# Patient Record
Sex: Male | Born: 1990
Health system: Southern US, Community
[De-identification: ages and names within clinical notes are randomized; demographics above are authoritative.]

## PROBLEM LIST (undated history)

## (undated) ENCOUNTER — Ambulatory Visit (HOSPITAL_COMMUNITY): Payer: Self-pay

## (undated) DIAGNOSIS — G35 Multiple sclerosis: Secondary | ICD-10-CM

## (undated) HISTORY — PX: ARTHROSCOPIC REPAIR ACL: SUR80

---
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
## 2016-04-04 DIAGNOSIS — M25511 Pain in right shoulder: Secondary | ICD-10-CM | POA: Diagnosis not present

## 2016-04-04 DIAGNOSIS — R292 Abnormal reflex: Secondary | ICD-10-CM | POA: Diagnosis not present

## 2016-04-07 DIAGNOSIS — G373 Acute transverse myelitis in demyelinating disease of central nervous system: Secondary | ICD-10-CM | POA: Diagnosis not present

## 2016-04-14 DIAGNOSIS — G379 Demyelinating disease of central nervous system, unspecified: Secondary | ICD-10-CM | POA: Diagnosis not present

## 2016-04-14 DIAGNOSIS — R930 Abnormal findings on diagnostic imaging of skull and head, not elsewhere classified: Secondary | ICD-10-CM | POA: Diagnosis not present

## 2016-04-17 DIAGNOSIS — G373 Acute transverse myelitis in demyelinating disease of central nervous system: Secondary | ICD-10-CM | POA: Diagnosis not present

## 2016-04-21 DIAGNOSIS — G971 Other reaction to spinal and lumbar puncture: Secondary | ICD-10-CM | POA: Diagnosis not present

## 2016-04-24 DIAGNOSIS — R930 Abnormal findings on diagnostic imaging of skull and head, not elsewhere classified: Secondary | ICD-10-CM | POA: Diagnosis not present

## 2016-04-24 DIAGNOSIS — G379 Demyelinating disease of central nervous system, unspecified: Secondary | ICD-10-CM | POA: Diagnosis not present

## 2016-05-03 DIAGNOSIS — G35 Multiple sclerosis: Secondary | ICD-10-CM | POA: Diagnosis not present

## 2016-06-06 DIAGNOSIS — G959 Disease of spinal cord, unspecified: Secondary | ICD-10-CM | POA: Diagnosis not present

## 2016-06-06 DIAGNOSIS — G35 Multiple sclerosis: Secondary | ICD-10-CM | POA: Diagnosis not present

## 2016-09-05 DIAGNOSIS — G35 Multiple sclerosis: Secondary | ICD-10-CM | POA: Diagnosis not present

## 2016-09-09 ENCOUNTER — Ambulatory Visit: Payer: Self-pay | Admitting: Family Medicine

## 2016-09-18 DIAGNOSIS — G35 Multiple sclerosis: Secondary | ICD-10-CM | POA: Insufficient documentation

## 2016-09-26 ENCOUNTER — Encounter: Payer: Self-pay | Admitting: Family Medicine

## 2016-09-26 ENCOUNTER — Ambulatory Visit (INDEPENDENT_AMBULATORY_CARE_PROVIDER_SITE_OTHER): Payer: 59 | Admitting: Family Medicine

## 2016-09-26 VITALS — BP 106/63 | HR 63 | Temp 98.6°F | Resp 16 | Ht 68.11 in | Wt 161.4 lb

## 2016-09-26 DIAGNOSIS — Z79899 Other long term (current) drug therapy: Secondary | ICD-10-CM

## 2016-09-26 DIAGNOSIS — G35 Multiple sclerosis: Secondary | ICD-10-CM

## 2016-09-26 DIAGNOSIS — Z136 Encounter for screening for cardiovascular disorders: Secondary | ICD-10-CM

## 2016-09-26 NOTE — Patient Instructions (Addendum)
  EKG will be in our electronic record that should be accessible from your neurologist, but I have also provided a copy for you if needed.  Let me know if there are any questions or concerns, and we are happy to provide primary care for you when you are here in town.  If you continue to have fatigue or sleepiness, would discuss that with your neurologist as that may be related to other causes. Potentially could also meet with a sleep specialist to see if that requires other testing.   IF you received an x-ray today, you will receive an invoice from [facility]. Please contact [facility] at [phone] with questions or concerns regarding your invoice.   IF you received labwork today, you will receive an invoice from [facility]. Please contact [facility] at [phone] with questions or concerns regarding your invoice.   Our billing staff will not be able to assist you with questions regarding bills from these companies.  You will be contacted with the lab results as soon as they are available. The fastest way to get your results is to activate your My Chart account. Instructions are located on the last page of this paperwork. If you have not heard from [place] regarding the results in 2 weeks, please contact this office.

## 2016-09-26 NOTE — Progress Notes (Signed)
By signing my name below, I, Mesha Guinyard, attest that this documentation has been prepared under the direction and in the presence of Meredith Staggers, MD.  Electronically Signed: Arvilla Market, Medical Scribe. 09/26/16. 4:51 PM.  Subjective:    Patient ID: Juan Mahoney, male    DOB: 1990-09-03, 26 y.o.   MRN: [id number]  HPI Chief Complaint  Patient presents with  . Abnormal ECG    new dx of MS, needs EKG to start medications    HPI Comments: Juan Mahoney is a 26 y.o. male who presents to Primary Care at [facility] for EKG. He has been followed by Dr. Gaynelle Adu at [facility]. Recent notes reviewed in Care Everywhere. Most recent note from June 2018. EKG required before starting gilenya. Most recent visit with neurologist June 11th. His initial consult with neurology was June 5th. Appears he had a recent dx of MS, with MRI on the brain Jan 18th showing several demyelinating lesions, as well as a demyelinating lesion at the cervical medullary junction. Spinal tap positive with elevated IGG index, elevated IGG synthesis rate and 8 unique CSF, oligoclonal bands. FHx of MS with great uncle on his fathers side. Testing performed through [facility], including eye exam, needs EKG today.  Pt's first sxs of MS is when he had upper arm pain that last 2 months, then he couldn't write or dibble the ball last fall. Pt had heart murmur as a baby, but grew out of it. No previous EKGs were done. Pt used to play sports and he's never had chest pain or SOB while playing. Denies current weakness, or numbness.  There are no active problems to display for this patient.  No past medical history on file. Past Surgical History:  Procedure Laterality Date  . ARTHROSCOPIC REPAIR ACL Right    Allergies  Allergen Reactions  . Penicillin G Rash   Prior to Admission medications   Not on File   Social History   Social History  . Marital status: Single    Spouse name: N/A  . Number of  children: N/A  . Years of education: N/A   Occupational History  . Not on file.   Social History Main Topics  . Smoking status: Never Smoker  . Smokeless tobacco: Never Used  . Alcohol use No  . Drug use: No  . Sexual activity: Not Currently   Other Topics Concern  . Not on file   Social History Narrative  . No narrative on file   Review of Systems  Respiratory: Negative for shortness of breath.   Cardiovascular: Negative for chest pain.  Neurological: Negative for weakness and numbness.   Objective:  Physical Exam  Constitutional: He appears well-developed and well-nourished. No distress.  HENT:  Head: Normocephalic and atraumatic.  Eyes: Conjunctivae are normal.  Neck: Neck supple.  Cardiovascular: Normal rate, regular rhythm and normal heart sounds.  Exam reveals no gallop and no friction rub.   No murmur heard. No murmur with valsalva  Pulmonary/Chest: Effort normal and breath sounds normal. No respiratory distress. He has no wheezes. He has no rales.  Neurological: He is alert.  Skin: Skin is warm and dry.  Psychiatric: He has a normal mood and affect. His behavior is normal.  Nursing note and vitals reviewed.   Vitals:   09/26/16 1635  BP: 106/63  Pulse: 63  Resp: 16  Temp: 98.6 F (37 C)  TempSrc: Oral  SpO2: 99%  Weight: 161 lb 6.4  oz (73.2 kg)  Height: 5' 8.11" (1.73 m)  Body mass index is 24.46 kg/m.   [EKG Reading]: Sinus bradycardia. Rate 57.  Assessment & Plan:  Juan Mahoney is a 26 y.o. male Screening for cardiovascular condition - Plan: EKG 12-Lead  High risk medication use - Plan: EKG 12-Lead  Multiple sclerosis (HCC)  Relatively recent diagnosis of multiple sclerosis with plan for new medication by neurology. EKG obtained and copy given to patient.  He did endorse some symptoms of fatigue/sleepiness. Advised to discuss with neurology initially, then can follow-up or can have sleep specialist eval to look into other causes.  No  orders of the defined types were placed in this encounter.  Patient Instructions    EKG will be in our electronic record that should be accessible from your neurologist, but I have also provided a copy for you if needed.  Let me know if there are any questions or concerns, and we are happy to provide primary care for you when you are here in town.  If you continue to have fatigue or sleepiness, would discuss that with your neurologist as that may be related to other causes. Potentially could also meet with a sleep specialist to see if that requires other testing.   IF you received an x-ray today, you will receive an invoice from [facility]. Please contact [facility] at [phone] with questions or concerns regarding your invoice.   IF you received labwork today, you will receive an invoice from [place]. Please contact [facility] at [phone] with questions or concerns regarding your invoice.   Our billing staff will not be able to assist you with questions regarding bills from these companies.  You will be contacted with the lab results as soon as they are available. The fastest way to get your results is to activate your My Chart account. Instructions are located on the last page of this paperwork. If you have not heard from [place] regarding the results in 2 weeks, please contact this office.      I personally performed the services described in this documentation, which was scribed in my presence. The recorded information has been reviewed and considered for accuracy and completeness, addended by me as needed, and agree with information above.  Signed,   Meredith Staggers, MD Primary Care at [facility].  09/30/16 5:23 PM

## 2016-12-16 ENCOUNTER — Encounter (HOSPITAL_COMMUNITY): Payer: Self-pay | Admitting: Emergency Medicine

## 2016-12-16 ENCOUNTER — Emergency Department (HOSPITAL_COMMUNITY)
Admission: EM | Admit: 2016-12-16 | Discharge: 2016-12-16 | Disposition: A | Discharge status: Home / Self Care | Payer: 59 | Attending: Emergency Medicine | Admitting: Emergency Medicine

## 2016-12-16 DIAGNOSIS — Y93G3 Activity, cooking and baking: Secondary | ICD-10-CM | POA: Diagnosis not present

## 2016-12-16 DIAGNOSIS — X102XXA Contact with fats and cooking oils, initial encounter: Secondary | ICD-10-CM | POA: Insufficient documentation

## 2016-12-16 DIAGNOSIS — T24202A Burn of second degree of unspecified site of left lower limb, except ankle and foot, initial encounter: Secondary | ICD-10-CM | POA: Diagnosis not present

## 2016-12-16 DIAGNOSIS — G35 Multiple sclerosis: Secondary | ICD-10-CM | POA: Diagnosis not present

## 2016-12-16 DIAGNOSIS — T22211A Burn of second degree of right forearm, initial encounter: Secondary | ICD-10-CM | POA: Diagnosis not present

## 2016-12-16 DIAGNOSIS — Y92000 Kitchen of unspecified non-institutional (private) residence as  the place of occurrence of the external cause: Secondary | ICD-10-CM | POA: Diagnosis not present

## 2016-12-16 DIAGNOSIS — T24002A Burn of unspecified degree of unspecified site of left lower limb, except ankle and foot, initial encounter: Secondary | ICD-10-CM

## 2016-12-16 DIAGNOSIS — T24012A Burn of unspecified degree of left thigh, initial encounter: Secondary | ICD-10-CM | POA: Insufficient documentation

## 2016-12-16 DIAGNOSIS — T310 Burns involving less than 10% of body surface: Secondary | ICD-10-CM | POA: Insufficient documentation

## 2016-12-16 DIAGNOSIS — T22011A Burn of unspecified degree of right forearm, initial encounter: Secondary | ICD-10-CM | POA: Diagnosis not present

## 2016-12-16 DIAGNOSIS — T23001A Burn of unspecified degree of right hand, unspecified site, initial encounter: Secondary | ICD-10-CM | POA: Insufficient documentation

## 2016-12-16 DIAGNOSIS — Z23 Encounter for immunization: Secondary | ICD-10-CM | POA: Insufficient documentation

## 2016-12-16 DIAGNOSIS — Y999 Unspecified external cause status: Secondary | ICD-10-CM | POA: Diagnosis not present

## 2016-12-16 MED ORDER — SILVER SULFADIAZINE 1 % EX CREA
TOPICAL_CREAM | Freq: Once | CUTANEOUS | Status: AC
  Administered 2016-12-16: 23:00:00 via TOPICAL
  Filled 2016-12-16: qty 85

## 2016-12-16 MED ORDER — SILVER SULFADIAZINE 1 % EX CREA: 1.0000 "application " | TOPICAL_CREAM | Freq: Every day | CUTANEOUS | 0 refills | Status: DC

## 2016-12-16 MED ORDER — OXYCODONE-ACETAMINOPHEN 5-325 MG PO TABS: 1.0000 | ORAL_TABLET | Freq: Four times a day (QID) | ORAL | 0 refills | Status: DC | PRN

## 2016-12-16 MED ORDER — TETANUS-DIPHTH-ACELL PERTUSSIS 5-2.5-18.5 LF-MCG/0.5 IM SUSP
0.5000 mL | Freq: Once | INTRAMUSCULAR | Status: AC
  Administered 2016-12-16: 0.5 mL via INTRAMUSCULAR
  Filled 2016-12-16: qty 0.5

## 2016-12-16 MED ORDER — OXYCODONE-ACETAMINOPHEN 5-325 MG PO TABS
1.0000 | ORAL_TABLET | Freq: Once | ORAL | Status: AC
  Administered 2016-12-16: 1 via ORAL
  Filled 2016-12-16: qty 1
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
## 2016-12-16 NOTE — Discharge Instructions (Signed)
Please take Ibuprofen (Advil, motrin) and Tylenol (acetaminophen) to relieve your pain.  You may take up to 600 MG (3 pills) of normal strength ibuprofen every 8 hours as needed.  In between doses of ibuprofen you make take tylenol, up to 1,000 mg (two extra strength pills).  Do not take more than 3,000 mg tylenol in a 24 hour period.  Please check all medication labels as many medications such as pain and cold medications may contain tylenol.  Do not drink alcohol while taking these medications.  Do not take other NSAID'S while taking ibuprofen (such as aleve or naproxen).  Please take ibuprofen with food to decrease stomach upset. ° °Today you received medications that may make you sleepy or impair your ability to make decisions.  For the next 24 hours please do not drive, operate heavy machinery, care for a small child with out another adult present, or perform any activities that may cause harm to you or someone else if you were to fall asleep or be impaired.  ° °You are being prescribed a medication which may make you sleepy. Please follow up of listed precautions for at least 24 hours after taking one dose. ° °While in the ED your blood pressure was high.  Please follow up with your primary care doctor or the wellness clinic for repeat evaluation as you may need medication.  High blood pressure can cause long term, potentially serious, damage if left untreated.  ° ° °

## 2016-12-16 NOTE — ED Notes (Signed)
ED Provider at bedside. 
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
## 2016-12-21 DIAGNOSIS — T23211A Burn of second degree of right thumb (nail), initial encounter: Secondary | ICD-10-CM | POA: Diagnosis not present

## 2016-12-21 DIAGNOSIS — T23271A Burn of second degree of right wrist, initial encounter: Secondary | ICD-10-CM | POA: Diagnosis not present

## 2016-12-21 DIAGNOSIS — G35 Multiple sclerosis: Secondary | ICD-10-CM | POA: Diagnosis not present

## 2016-12-28 DIAGNOSIS — T23271A Burn of second degree of right wrist, initial encounter: Secondary | ICD-10-CM | POA: Diagnosis not present

## 2016-12-28 DIAGNOSIS — T23211A Burn of second degree of right thumb (nail), initial encounter: Secondary | ICD-10-CM | POA: Diagnosis not present

## 2017-09-20 ENCOUNTER — Ambulatory Visit: Payer: 59 | Admitting: Family Medicine

## 2017-09-21 ENCOUNTER — Ambulatory Visit: Payer: 59 | Admitting: Family Medicine

## 2017-11-21 DIAGNOSIS — Z23 Encounter for immunization: Secondary | ICD-10-CM | POA: Diagnosis not present

## 2018-03-29 ENCOUNTER — Other Ambulatory Visit: Payer: Self-pay

## 2018-03-29 ENCOUNTER — Observation Stay (HOSPITAL_COMMUNITY)
Admission: EM | Admit: 2018-03-29 | Discharge: 2018-03-31 | Disposition: A | Discharge status: Home / Self Care | Payer: 59 | Attending: Internal Medicine | Admitting: Internal Medicine

## 2018-03-29 ENCOUNTER — Encounter (HOSPITAL_COMMUNITY): Payer: Self-pay | Admitting: *Deleted

## 2018-03-29 DIAGNOSIS — N179 Acute kidney failure, unspecified: Secondary | ICD-10-CM | POA: Insufficient documentation

## 2018-03-29 DIAGNOSIS — G35 Multiple sclerosis: Secondary | ICD-10-CM | POA: Diagnosis not present

## 2018-03-29 DIAGNOSIS — R112 Nausea with vomiting, unspecified: Secondary | ICD-10-CM | POA: Diagnosis not present

## 2018-03-29 DIAGNOSIS — K862 Cyst of pancreas: Secondary | ICD-10-CM | POA: Diagnosis not present

## 2018-03-29 DIAGNOSIS — E872 Acidosis, unspecified: Secondary | ICD-10-CM

## 2018-03-29 DIAGNOSIS — E860 Dehydration: Secondary | ICD-10-CM | POA: Diagnosis present

## 2018-03-29 DIAGNOSIS — R1013 Epigastric pain: Secondary | ICD-10-CM | POA: Diagnosis not present

## 2018-03-29 DIAGNOSIS — D72829 Elevated white blood cell count, unspecified: Secondary | ICD-10-CM | POA: Diagnosis not present

## 2018-03-29 DIAGNOSIS — A419 Sepsis, unspecified organism: Secondary | ICD-10-CM | POA: Diagnosis not present

## 2018-03-29 DIAGNOSIS — K529 Noninfective gastroenteritis and colitis, unspecified: Secondary | ICD-10-CM | POA: Diagnosis present

## 2018-03-29 DIAGNOSIS — R1110 Vomiting, unspecified: Secondary | ICD-10-CM | POA: Diagnosis not present

## 2018-03-29 DIAGNOSIS — R109 Unspecified abdominal pain: Secondary | ICD-10-CM | POA: Diagnosis not present

## 2018-03-29 HISTORY — DX: Multiple sclerosis: G35

## 2018-03-29 NOTE — ED Triage Notes (Signed)
Pt reports n/v/d with abd pain since 1800 today.  Vomited x 1 and had one diarrhea.  Pt has hx of MS.  He is in NAD.

## 2018-03-30 ENCOUNTER — Emergency Department (HOSPITAL_COMMUNITY): Payer: 59

## 2018-03-30 ENCOUNTER — Encounter (HOSPITAL_COMMUNITY): Payer: Self-pay

## 2018-03-30 ENCOUNTER — Other Ambulatory Visit: Payer: Self-pay

## 2018-03-30 DIAGNOSIS — R1110 Vomiting, unspecified: Secondary | ICD-10-CM | POA: Diagnosis not present

## 2018-03-30 DIAGNOSIS — R112 Nausea with vomiting, unspecified: Secondary | ICD-10-CM | POA: Diagnosis present

## 2018-03-30 DIAGNOSIS — E860 Dehydration: Secondary | ICD-10-CM | POA: Diagnosis not present

## 2018-03-30 DIAGNOSIS — N179 Acute kidney failure, unspecified: Secondary | ICD-10-CM | POA: Diagnosis present

## 2018-03-30 DIAGNOSIS — G35 Multiple sclerosis: Secondary | ICD-10-CM | POA: Diagnosis not present

## 2018-03-30 DIAGNOSIS — K529 Noninfective gastroenteritis and colitis, unspecified: Secondary | ICD-10-CM | POA: Diagnosis present

## 2018-03-30 LAB — COMPREHENSIVE METABOLIC PANEL
ALT: 27 U/L (ref 0–44)
ANION GAP: 13 (ref 5–15)
AST: 36 U/L (ref 15–41)
Albumin: 4.9 g/dL (ref 3.5–5.0)
Alkaline Phosphatase: 57 U/L (ref 38–126)
BUN: 18 mg/dL (ref 6–20)
CO2: 24 mmol/L (ref 22–32)
Calcium: 9.6 mg/dL (ref 8.9–10.3)
Chloride: 102 mmol/L (ref 98–111)
Creatinine, Ser: 1.41 mg/dL — ABNORMAL HIGH (ref 0.61–1.24)
GFR calc non Af Amer: >60 mL/min (ref >60)
Glucose, Bld: 125 mg/dL — ABNORMAL HIGH (ref 70–99)
Potassium: 3.9 mmol/L (ref 3.5–5.1)
Sodium: 139 mmol/L (ref 135–145)
Total Bilirubin: 1.7 mg/dL — ABNORMAL HIGH (ref 0.3–1.2)
Total Protein: 8.1 g/dL (ref 6.5–8.1)

## 2018-03-30 LAB — URINALYSIS, ROUTINE W REFLEX MICROSCOPIC
Bilirubin Urine: NEGATIVE
Glucose, UA: NEGATIVE mg/dL
Hgb urine dipstick: NEGATIVE
Ketones, ur: NEGATIVE mg/dL
Leukocytes, UA: NEGATIVE
NITRITE: NEGATIVE
Protein, ur: NEGATIVE mg/dL
SPECIFIC GRAVITY, URINE: 1.03 (ref 1.005–1.030)
pH: 6 (ref 5.0–8.0)

## 2018-03-30 LAB — INFLUENZA PANEL BY PCR (TYPE A & B)
Influenza A By PCR: NEGATIVE
Influenza B By PCR: NEGATIVE

## 2018-03-30 LAB — CBC
HCT: 52.4 % — ABNORMAL HIGH (ref 39.0–52.0)
HEMOGLOBIN: 17 g/dL (ref 13.0–17.0)
MCH: 27.1 pg (ref 26.0–34.0)
MCHC: 32.4 g/dL (ref 30.0–36.0)
MCV: 83.4 fL (ref 80.0–100.0)
Platelets: 269 10*3/uL (ref 150–400)
RBC: 6.28 MIL/uL — AB (ref 4.22–5.81)
RDW: 12.3 % (ref 11.5–15.5)
WBC: 19.3 10*3/uL — ABNORMAL HIGH (ref 4.0–10.5)
nRBC: 0 % (ref 0.0–0.2)

## 2018-03-30 LAB — LACTIC ACID, PLASMA: Lactic Acid, Venous: 1 mmol/L (ref 0.5–1.9)

## 2018-03-30 LAB — LIPASE, BLOOD: Lipase: 26 U/L (ref 11–51)

## 2018-03-30 LAB — I-STAT CG4 LACTIC ACID, ED
LACTIC ACID, VENOUS: 2.51 mmol/L — AB (ref 0.5–1.9)
LACTIC ACID, VENOUS: 3.52 mmol/L — AB (ref 0.5–1.9)

## 2018-03-30 MED ORDER — SODIUM CHLORIDE 0.9 % IV SOLN
2.0000 g | INTRAVENOUS | Status: DC
  Administered 2018-03-30: 2 g via INTRAVENOUS
  Filled 2018-03-30: qty 20

## 2018-03-30 MED ORDER — ONDANSETRON HCL 4 MG/2ML IJ SOLN: 4.0000 mg | Freq: Four times a day (QID) | INTRAMUSCULAR | Status: DC | PRN

## 2018-03-30 MED ORDER — SODIUM CHLORIDE (PF) 0.9 % IJ SOLN
INTRAMUSCULAR | Status: AC
  Administered 2018-03-30: 30 mL
  Filled 2018-03-30: qty 50

## 2018-03-30 MED ORDER — LACTATED RINGERS IV SOLN
INTRAVENOUS | Status: DC
  Administered 2018-03-30: 17:00:00 via INTRAVENOUS
  Administered 2018-03-30: 125 mL/h via INTRAVENOUS
  Administered 2018-03-31: 02:00:00 via INTRAVENOUS

## 2018-03-30 MED ORDER — ONDANSETRON HCL 4 MG PO TABS: 4.0000 mg | ORAL_TABLET | Freq: Four times a day (QID) | ORAL | Status: DC | PRN

## 2018-03-30 MED ORDER — METRONIDAZOLE IN NACL 5-0.79 MG/ML-% IV SOLN
500.0000 mg | Freq: Three times a day (TID) | INTRAVENOUS | Status: DC
  Administered 2018-03-30: 500 mg via INTRAVENOUS
  Filled 2018-03-30: qty 100

## 2018-03-30 MED ORDER — KETOROLAC TROMETHAMINE 15 MG/ML IJ SOLN
15.0000 mg | Freq: Once | INTRAMUSCULAR | Status: AC
  Administered 2018-03-30: 15 mg via INTRAVENOUS
  Filled 2018-03-30: qty 1

## 2018-03-30 MED ORDER — SODIUM CHLORIDE 0.9 % IV BOLUS
1000.0000 mL | Freq: Once | INTRAVENOUS | Status: AC
  Administered 2018-03-30: 1000 mL via INTRAVENOUS

## 2018-03-30 MED ORDER — IOPAMIDOL (ISOVUE-300) INJECTION 61%
INTRAVENOUS | Status: AC
  Filled 2018-03-30: qty 100

## 2018-03-30 MED ORDER — SODIUM CHLORIDE 0.9 % IV BOLUS
500.0000 mL | Freq: Once | INTRAVENOUS | Status: AC
  Administered 2018-03-30: 500 mL via INTRAVENOUS

## 2018-03-30 MED ORDER — IOPAMIDOL (ISOVUE-300) INJECTION 61%
100.0000 mL | Freq: Once | INTRAVENOUS | Status: AC | PRN
  Administered 2018-03-30: 100 mL via INTRAVENOUS

## 2018-03-30 MED ORDER — ENOXAPARIN SODIUM 40 MG/0.4ML ~~LOC~~ SOLN
40.0000 mg | Freq: Every day | SUBCUTANEOUS | Status: DC
  Administered 2018-03-30: 40 mg via SUBCUTANEOUS
  Filled 2018-03-30: qty 0.4

## 2018-03-30 MED ORDER — ACETAMINOPHEN 325 MG PO TABS: 650.0000 mg | ORAL_TABLET | Freq: Four times a day (QID) | ORAL | Status: DC | PRN

## 2018-03-30 MED ORDER — SODIUM CHLORIDE 0.9 % IV BOLUS (SEPSIS): 250.0000 mL | Freq: Once | INTRAVENOUS | Status: DC

## 2018-03-30 MED ORDER — ONDANSETRON HCL 4 MG/2ML IJ SOLN
4.0000 mg | Freq: Once | INTRAMUSCULAR | Status: AC
  Administered 2018-03-30: 4 mg via INTRAVENOUS
  Filled 2018-03-30: qty 2

## 2018-03-30 MED ORDER — ACETAMINOPHEN 650 MG RE SUPP: 650.0000 mg | Freq: Four times a day (QID) | RECTAL | Status: DC | PRN

## 2018-03-30 NOTE — ED Notes (Signed)
ED TO INPATIENT HANDOFF REPORT  Name/Age/Gender Juan Mahoney 27 y.o. male  Code Status    Code Status Orders  (From admission, onward)         Start     Ordered   03/30/18 0912  Full code  Continuous     03/30/18 0911        Code Status History    This patient has a current code status but no historical code status.      Home/SNF/Other Home  Chief Complaint MS Patient/Flu-Like Symptoms  Level of Care/Admitting Diagnosis ED Disposition    ED Disposition Condition Comment   Admit  [facility]: [facility] [[id number]]  Level of Care: Med-Surg [16]  Diagnosis: Gastroenteritis [161096][[id number]]  Admitting Physician: Tyrone NineGRUNZ, RYAN B [[id number]]  Attending Physician: Tyrone NineGRUNZ, RYAN B [phone][[id number]]  PT Class (Do Not Modify): Observation [104]  PT Acc Code (Do Not Modify): Observation [10022]       Medical History Past Medical History:  Diagnosis Date  . Multiple sclerosis (HCC)     Allergies Allergies  Allergen Reactions  . Penicillin G Rash    DID THE REACTION INVOLVE: Swelling of the face/tongue/throat, SOB, or low BP? No Sudden or severe rash/hives, skin peeling, or the inside of the mouth or nose? Yes Did it require medical treatment? No When did it last happen?2015 If all above answers are "NO", may proceed with cephalosporin use.     IV Location/Drains/Wounds Patient Lines/Drains/Airways Status   Active Line/Drains/Airways    Name:   Placement date:   Placement time:   Site:   Days:   Peripheral IV 03/30/18 Right Antecubital   03/30/18    0003    Antecubital   less than 1   Peripheral IV 03/30/18 Left Antecubital   03/30/18    0120    Antecubital   less than 1          Labs/Imaging Results for orders placed or performed during the hospital encounter of 03/29/18 (from the past 48 hour(s))  Lipase, blood     Status: None   Collection Time: 03/30/18 12:00 AM  Result Value Ref Range   Lipase 26 11 - 51 U/L    Comment: Performed at [facility], [address] [place], [place] [address]  Comprehensive metabolic panel     Status: Abnormal   Collection Time: 03/30/18 12:00 AM  Result Value Ref Range   Sodium 139 135 - 145 mmol/L   Potassium 3.9 3.5 - 5.1 mmol/L   Chloride 102 98 - 111 mmol/L   CO2 24 22 - 32 mmol/L   Glucose, Bld 125 (H) 70 - 99 mg/dL   BUN 18 6 - 20 mg/dL   Creatinine, Ser 8.291.41 (H) 0.61 - 1.24 mg/dL   Calcium 9.6 8.9 - 56.210.3 mg/dL   Total Protein 8.1 6.5 - 8.1 g/dL   Albumin 4.9 3.5 - 5.0 g/dL   AST 36 15 - 41 U/L   ALT 27 0 - 44 U/L   Alkaline Phosphatase 57 38 - 126 U/L   Total Bilirubin 1.7 (H) 0.3 - 1.2 mg/dL   GFR calc non Af Amer >60 >60 mL/min   GFR calc Af Amer >60 >60 mL/min   Anion gap 13 5 - 15    Comment: Performed at [facility], [address] [place], [place] [address]  CBC     Status: Abnormal   Collection Time: 03/30/18 12:00 AM  Result Value  Ref Range   WBC 19.3 (H) 4.0 - 10.5 K/uL   RBC 6.28 (H) 4.22 - 5.81 MIL/uL   Hemoglobin 17.0 13.0 - 17.0 g/dL   HCT 67.6 (H) 19.5 - 09.3 %   MCV 83.4 80.0 - 100.0 fL   MCH 27.1 26.0 - 34.0 pg   MCHC 32.4 30.0 - 36.0 g/dL   RDW 26.7 12.4 - 58.0 %   Platelets 269 150 - 400 K/uL   nRBC 0.0 0.0 - 0.2 %    Comment: Performed at [facility], [address] [place], [place] [address]  Influenza panel by PCR (type A & B)     Status: None   Collection Time: 03/30/18 12:28 AM  Result Value Ref Range   Influenza A By PCR NEGATIVE NEGATIVE   Influenza B By PCR NEGATIVE NEGATIVE    Comment: (NOTE) The Xpert Xpress Flu assay is intended as an aid in the diagnosis of  influenza and should not be used as a sole basis for treatment.  This  assay is FDA approved for nasopharyngeal swab specimens only. Nasal  washings and aspirates are unacceptable for Xpert Xpress Flu testing. Performed at [facility], [address] [place], [place] [address]   I-Stat CG4 Lactic Acid, ED      Status: Abnormal   Collection Time: 03/30/18 12:39 AM  Result Value Ref Range   Lactic Acid, Venous 3.52 (HH) 0.5 - 1.9 mmol/L   Comment NOTIFIED PHYSICIAN   Blood Culture (routine x 2)     Status: None (Preliminary result)   Collection Time: 03/30/18 12:54 AM  Result Value Ref Range   Specimen Description      BLOOD LEFT HAND Performed at [facility], [address] [place], [place] [address]    Special Requests      BOTTLES DRAWN AEROBIC AND ANAEROBIC Blood Culture results may not be optimal due to an inadequate volume of blood received in culture bottles Performed at [facility], [address], [place], [place] [address]    Culture      NO GROWTH < 12 HOURS Performed at [facility], [address] [place], [place] [address]    Report Status PENDING   Blood Culture (routine x 2)     Status: None (Preliminary result)   Collection Time: 03/30/18 12:54 AM  Result Value Ref Range   Specimen Description      BLOOD LEFT ANTECUBITAL Performed at [facility], [address] [place], [place] [address]    Special Requests      BOTTLES DRAWN AEROBIC AND ANAEROBIC Blood Culture adequate volume Performed at [facility], [address] [place], [place] [address]    Culture      NO GROWTH < 12 HOURS Performed at [facility], [address], [place], [place] [address]    Report Status PENDING   I-Stat CG4 Lactic Acid, ED     Status: Abnormal   Collection Time: 03/30/18  2:42 AM  Result Value Ref Range   Lactic Acid, Venous 2.51 (HH) 0.5 - 1.9 mmol/L   Comment NOTIFIED PHYSICIAN   Urinalysis, Routine w reflex microscopic     Status: Abnormal   Collection Time: 03/30/18  4:23 AM  Result Value Ref Range   Color, Urine STRAW (A) YELLOW   APPearance CLEAR CLEAR   Specific Gravity, Urine 1.030 1.005 - 1.030   pH 6.0 5.0 - 8.0  Glucose, UA NEGATIVE NEGATIVE mg/dL   Hgb urine dipstick NEGATIVE  NEGATIVE   Bilirubin Urine NEGATIVE NEGATIVE   Ketones, ur NEGATIVE NEGATIVE mg/dL   Protein, ur NEGATIVE NEGATIVE mg/dL   Nitrite NEGATIVE NEGATIVE   Leukocytes, UA NEGATIVE NEGATIVE    Comment: Performed at [facility], [address] [place], [place] [address]   Ct Abdomen Pelvis W Contrast  Result Date: 03/30/2018 CLINICAL DATA:  Acute onset of generalized abdominal pain and nausea. Vomiting. Personal history of multiple sclerosis. EXAM: CT ABDOMEN AND PELVIS WITH CONTRAST TECHNIQUE: Multidetector CT imaging of the abdomen and pelvis was performed using the standard protocol following bolus administration of intravenous contrast. CONTRAST:  ISOVUE-300 IOPAMIDOL (ISOVUE-300) INJECTION 61% COMPARISON:  None. FINDINGS: Lower chest: The visualized lung bases are grossly clear. The visualized portions of the mediastinum are unremarkable. Hepatobiliary: The liver is unremarkable in appearance. The gallbladder is unremarkable in appearance. The common bile duct remains normal in caliber. Pancreas: An apparent 6 mm cystic focus at the pancreatic tail is likely benign, given the patient's age. Spleen: The spleen is unremarkable in appearance. Adrenals/Urinary Tract: The adrenal glands are unremarkable in appearance. The kidneys are within normal limits. There is no evidence of hydronephrosis. No renal or ureteral stones are identified. No perinephric stranding is seen. Stomach/Bowel: The stomach is unremarkable in appearance. The small bowel is within normal limits. The appendix is normal in caliber, without evidence of appendicitis. The colon is unremarkable in appearance. Vascular/Lymphatic: The abdominal aorta is unremarkable in appearance. The inferior vena cava is grossly unremarkable. No retroperitoneal lymphadenopathy is seen. No pelvic sidewall lymphadenopathy is identified. Reproductive: The bladder is decompressed and not well characterized. The prostate is normal in size.  Other: No additional soft tissue abnormalities are seen. Musculoskeletal: No acute osseous abnormalities are identified. The visualized musculature is unremarkable in appearance. IMPRESSION: 1. No acute abnormality seen within the abdomen or pelvis. 2. Apparent 6 mm cystic focus at the pancreatic tail is likely benign, given the patient's age. However, follow-up MRCP would be helpful in 12 months to ensure stability, as deemed clinically appropriate. Electronically Signed   By: Roanna Raider M.D.   On: 03/30/2018 01:17   EKG Interpretation  Date/Time:  Saturday March 30 2018 01:14:29 EST Ventricular Rate:  89 PR Interval:    QRS Duration: 82 QT Interval:  347 QTC Calculation: 423 R Axis:   90 Text Interpretation:  Sinus rhythm Borderline right axis deviation No significant change since last tracing Confirmed by Shaune Pollack [phone]) on 03/30/2018 5:10:28 AM   Pending Labs Unresulted Labs (From admission, onward)    Start     Ordered   04/06/18 0500  Creatinine, serum  (enoxaparin (LOVENOX)    CrCl >/= 30 ml/min)  Weekly,   R    Comments:  while on enoxaparin therapy    03/30/18 0911   03/31/18 0500  Basic metabolic panel  Tomorrow morning,   R     03/30/18 0911   03/31/18 0500  CBC  Tomorrow morning,   R     03/30/18 0911   03/30/18 0834  Lactic acid, plasma  Once,   R     03/30/18 0833   Signed and Held  HIV antibody (Routine Testing)  Tomorrow morning,   R     Signed and Held          Vitals/Pain Today's Vitals   03/30/18 0430 03/30/18 0500 03/30/18 0530 03/30/18 0722  BP: 113/62 108/65 (!) 100/53 Marland Kitchen)  99/57  Pulse: 96 92 91 88  Resp: 14 16 15 16   Temp:      TempSrc:      SpO2: 100% 100% 99% 99%  Weight:      Height:      PainSc:        Isolation Precautions No active isolations  Medications Medications  iopamidol (ISOVUE-300) 61 % injection (has no administration in time range)  enoxaparin (LOVENOX) injection 40 mg (has no administration in time range)   lactated ringers infusion (125 mL/hr Intravenous New Bag/Given 03/30/18 0920)  acetaminophen (TYLENOL) tablet 650 mg (has no administration in time range)    Or  acetaminophen (TYLENOL) suppository 650 mg (has no administration in time range)  ondansetron (ZOFRAN) tablet 4 mg (has no administration in time range)    Or  ondansetron (ZOFRAN) injection 4 mg (has no administration in time range)  sodium chloride 0.9 % bolus 1,000 mL (0 mLs Intravenous Stopped 03/30/18 0126)  sodium chloride 0.9 % bolus 1,000 mL (0 mLs Intravenous Stopped 03/30/18 0220)  ondansetron (ZOFRAN) injection 4 mg (4 mg Intravenous Given 03/30/18 0035)  ketorolac (TORADOL) 15 MG/ML injection 15 mg (15 mg Intravenous Given 03/30/18 0035)  sodium chloride 0.9 % bolus 500 mL (0 mLs Intravenous Stopped 03/30/18 0428)  iopamidol (ISOVUE-300) 61 % injection 100 mL (100 mLs Intravenous Contrast Given 03/30/18 0104)  sodium chloride (PF) 0.9 % injection (30 mLs  Given 03/30/18 0907)  sodium chloride 0.9 % bolus 1,000 mL (0 mLs Intravenous Stopped 03/30/18 0907)    Mobility walks

## 2018-03-30 NOTE — ED Notes (Signed)
Attempted to call report. No answer with charted number for receiving nurse for room 1520. Will try again.

## 2018-03-30 NOTE — ED Notes (Signed)
Patient transported to CT 

## 2018-03-30 NOTE — ED Provider Notes (Signed)
[facility]-EMERGENCY DEPT Provider Note   CSN: [id number] Arrival date & time: 03/29/18  2045     History   Chief Complaint Chief Complaint  Patient presents with  . Abdominal Pain  . Emesis    HPI Juan Mahoney is a 27 y.o. male.  HPI 27 year old male with history of multiple sclerosis here with abdominal pain, nausea, vomiting.  The patient states that his symptoms started acutely at around 6:00 today.  He felt fine until he developed acute onset of severe, diffuse abdominal pain along with nausea and vomiting.  He is been unable to eat or drink.  Has had profuse diarrhea as well.  He was well prior to the onset of symptoms.  He has not tried anything for this.  He has had associated chills, subjective fevers, and generalized fatigue.  He has had lightheadedness upon standing.  He endorses body aches throughout his abdomen as well.  No known recent sick contacts.  No suspicious food intakes.  No recent travel.  Past Medical History:  Diagnosis Date  . Multiple sclerosis [facility])     Patient Active Problem List   Diagnosis Date Noted  . Nausea & vomiting 03/30/2018  . MS (multiple sclerosis) (HCC) 09/18/2016    Past Surgical History:  Procedure Laterality Date  . ARTHROSCOPIC REPAIR ACL Right         Home Medications    Prior to Admission medications   Not on File    Family History Family History  Problem Relation Age of Onset  . Hypertension Mother   . Cancer Maternal Grandmother     Social History Social History   Tobacco Use  . Smoking status: Never Smoker  . Smokeless tobacco: Never Used  Substance Use Topics  . Alcohol use: No  . Drug use: Yes    Types: Marijuana     Allergies   Penicillin g   Review of Systems Review of Systems  Constitutional: Positive for fatigue. Negative for chills and fever.  HENT: Negative for congestion and rhinorrhea.   Eyes: Negative for visual disturbance.  Respiratory: Negative for  cough, shortness of breath and wheezing.   Cardiovascular: Negative for chest pain and leg swelling.  Gastrointestinal: Positive for abdominal pain, diarrhea, nausea and vomiting.  Genitourinary: Negative for dysuria and flank pain.  Musculoskeletal: Positive for arthralgias. Negative for neck pain and neck stiffness.  Skin: Negative for rash and wound.  Allergic/Immunologic: Negative for immunocompromised state.  Neurological: Negative for syncope, weakness and headaches.  All other systems reviewed and are negative.    Physical Exam Updated Vital Signs BP (!) 99/57   Pulse 88   Temp 98.1 F (36.7 C) (Oral)   Resp 16   Ht 5\' 9"  (1.753 m)   Wt 74.8 kg   SpO2 99%   BMI 24.37 kg/m   Physical Exam Vitals signs and nursing note reviewed.  Constitutional:      General: He is not in acute distress.    Appearance: He is well-developed. He is ill-appearing.  HENT:     Head: Normocephalic and atraumatic.  Eyes:     Conjunctiva/sclera: Conjunctivae normal.  Neck:     Musculoskeletal: Neck supple.  Cardiovascular:     Rate and Rhythm: Normal rate and regular rhythm.     Heart sounds: Normal heart sounds. No murmur. No friction rub.  Pulmonary:     Effort: Pulmonary effort is normal. No respiratory distress.     Breath sounds: Normal breath sounds.  No wheezing or rales.  Abdominal:     General: Bowel sounds are increased. There is no distension.     Palpations: Abdomen is soft.     Tenderness: There is generalized abdominal tenderness and tenderness in the epigastric area. There is guarding. There is no rebound.  Skin:    General: Skin is warm.     Capillary Refill: Capillary refill takes less than 2 seconds.  Neurological:     Mental Status: He is alert and oriented to person, place, and time.     Motor: No abnormal muscle tone.      ED Treatments / Results  Labs (all labs ordered are listed, but only abnormal results are displayed) Labs Reviewed  COMPREHENSIVE  METABOLIC PANEL - Abnormal; Notable for the following components:      Result Value   Glucose, Bld 125 (*)    Creatinine, Ser 1.41 (*)    Total Bilirubin 1.7 (*)    All other components within normal limits  CBC - Abnormal; Notable for the following components:   WBC 19.3 (*)    RBC 6.28 (*)    HCT 52.4 (*)    All other components within normal limits  URINALYSIS, ROUTINE W REFLEX MICROSCOPIC - Abnormal; Notable for the following components:   Color, Urine STRAW (*)    All other components within normal limits  I-STAT CG4 LACTIC ACID, ED - Abnormal; Notable for the following components:   Lactic Acid, Venous 3.52 (*)    All other components within normal limits  I-STAT CG4 LACTIC ACID, ED - Abnormal; Notable for the following components:   Lactic Acid, Venous 2.51 (*)    All other components within normal limits  CULTURE, BLOOD (ROUTINE X 2)  CULTURE, BLOOD (ROUTINE X 2)  LIPASE, BLOOD  INFLUENZA PANEL BY PCR (TYPE A & B)  URINALYSIS, ROUTINE W REFLEX MICROSCOPIC    EKG EKG Interpretation  Date/Time:  Saturday March 30 2018 01:14:29 EST Ventricular Rate:  89 PR Interval:    QRS Duration: 82 QT Interval:  347 QTC Calculation: 423 R Axis:   90 Text Interpretation:  Sinus rhythm Borderline right axis deviation No significant change since last tracing Confirmed by Shaune PollackIsaacs, Deyanna Mctier [phone]) on 03/30/2018 5:10:28 AM   Radiology Ct Abdomen Pelvis W Contrast  Result Date: 03/30/2018 CLINICAL DATA:  Acute onset of generalized abdominal pain and nausea. Vomiting. Personal history of multiple sclerosis. EXAM: CT ABDOMEN AND PELVIS WITH CONTRAST TECHNIQUE: Multidetector CT imaging of the abdomen and pelvis was performed using the standard protocol following bolus administration of intravenous contrast. CONTRAST:  100mL ISOVUE-300 IOPAMIDOL (ISOVUE-300) INJECTION 61% COMPARISON:  None. FINDINGS: Lower chest: The visualized lung bases are grossly clear. The visualized portions of the  mediastinum are unremarkable. Hepatobiliary: The liver is unremarkable in appearance. The gallbladder is unremarkable in appearance. The common bile duct remains normal in caliber. Pancreas: An apparent 6 mm cystic focus at the pancreatic tail is likely benign, given the patient's age. Spleen: The spleen is unremarkable in appearance. Adrenals/Urinary Tract: The adrenal glands are unremarkable in appearance. The kidneys are within normal limits. There is no evidence of hydronephrosis. No renal or ureteral stones are identified. No perinephric stranding is seen. Stomach/Bowel: The stomach is unremarkable in appearance. The small bowel is within normal limits. The appendix is normal in caliber, without evidence of appendicitis. The colon is unremarkable in appearance. Vascular/Lymphatic: The abdominal aorta is unremarkable in appearance. The inferior vena cava is grossly unremarkable. No retroperitoneal lymphadenopathy is  seen. No pelvic sidewall lymphadenopathy is identified. Reproductive: The bladder is decompressed and not well characterized. The prostate is normal in size. Other: No additional soft tissue abnormalities are seen. Musculoskeletal: No acute osseous abnormalities are identified. The visualized musculature is unremarkable in appearance. IMPRESSION: 1. No acute abnormality seen within the abdomen or pelvis. 2. Apparent 6 mm cystic focus at the pancreatic tail is likely benign, given the patient's age. However, follow-up MRCP would be helpful in 12 months to ensure stability, as deemed clinically appropriate. Electronically Signed   By: Roanna Raider M.D.   On: 03/30/2018 01:17    Procedures .Critical Care Performed by: Shaune Pollack, MD Authorized by: Shaune Pollack, MD   Critical care provider statement:    Critical care time (minutes):  35   Critical care time was exclusive of:  Separately billable procedures and treating other patients and teaching time   Critical care was necessary to  treat or prevent imminent or life-threatening deterioration of the following conditions:  Cardiac failure, circulatory failure, dehydration and sepsis   Critical care was time spent personally by me on the following activities:  Development of treatment plan with patient or surrogate, discussions with consultants, evaluation of patient's response to treatment, examination of patient, obtaining history from patient or surrogate, ordering and performing treatments and interventions, ordering and review of laboratory studies, ordering and review of radiographic studies, pulse oximetry, re-evaluation of patient's condition and review of old charts   I assumed direction of critical care for this patient from another provider in my specialty: no     (including critical care time)  Medications Ordered in ED Medications  cefTRIAXone (ROCEPHIN) 2 g in sodium chloride 0.9 % 100 mL IVPB (0 g Intravenous Stopped 03/30/18 0215)  metroNIDAZOLE (FLAGYL) IVPB 500 mg (0 mg Intravenous Stopped 03/30/18 0330)  iopamidol (ISOVUE-300) 61 % injection (has no administration in time range)  sodium chloride (PF) 0.9 % injection (has no administration in time range)  sodium chloride 0.9 % bolus 1,000 mL (0 mLs Intravenous Stopped 03/30/18 0126)  sodium chloride 0.9 % bolus 1,000 mL (0 mLs Intravenous Stopped 03/30/18 0220)  ondansetron (ZOFRAN) injection 4 mg (4 mg Intravenous Given 03/30/18 0035)  ketorolac (TORADOL) 15 MG/ML injection 15 mg (15 mg Intravenous Given 03/30/18 0035)  sodium chloride 0.9 % bolus 500 mL (0 mLs Intravenous Stopped 03/30/18 0428)  iopamidol (ISOVUE-300) 61 % injection 100 mL (100 mLs Intravenous Contrast Given 03/30/18 0104)  sodium chloride 0.9 % bolus 1,000 mL ( Intravenous Rate/Dose Verify 03/30/18 0346)     Initial Impression / Assessment and Plan / ED Course  I have reviewed the triage vital signs and the nursing notes.  Pertinent labs & imaging results that were available during my  care of the patient were reviewed by me and considered in my medical decision making (see chart for details).     27 yo M here w/ diffuse abd pain, nausea, vomiting. On arrival, pt afebrile, tachycardic and borderline hypotensive. LA elevated at 3.5 and WBC 19. Code sepsis initiated, broad spectrum ABX started. CT scan obtained and shows no focal abnormality. UA pending. LA remains elevated at 2.5 after 30 cc/kg and pt continues to feel nauesous, dizzy with standing - will admit.  Final Clinical Impressions(s) / ED Diagnoses   Final diagnoses:  Sepsis, due to unspecified organism, unspecified whether acute organ dysfunction present [facility])    ED Discharge Orders    None       Shaune Pollack, MD 03/30/18  0757  

## 2018-03-30 NOTE — H&P (Addendum)
History and Physical   Juan Mahoney ZOX:[id number] DOB: Jul 08, 1990 DOA: 03/29/2018  Referring MD/NP/PA: Dr. Erma Heritage, EDP PCP: Patient, No Pcp Per Outpatient Specialists: Neurology, Dr. Langston Masker  Patient coming from: Home  Chief Complaint: Nausea, vomiting  HPI: Juan Mahoney is a 27 y.o. male with a history of multiple sclerosis who presented to the ED with several hours of nausea and vomiting. He awoke from a nap yesterday at 3:30pm with a headache on both sides of the head that was mild-moderate, not unusual for him, not associated with visual changes. He ate a meal around 5:00pm and went to his cousin's house where he began having generalized abdominal cramping, nausea that was constant, severe, and ultimately causing NBNB vomiting. He also had an episode of loose stool without blood in it. Nausea persisted despite resting so his mother brought him to the ED.  ED Course: In the waiting room he continued to have nausea and vomiting and loose stools with his "stomach turning." In the ED room he was found to be hypotensive, tachycardic with lactic acid elevation to 3.5, WBC 19.3k, creatinine up to 1.41. 30cc'kg IVFs and ceftriaxone, flagyl were provided for presumptive sepsis due to intraabdominal infection. CT abdomen, however, was reassuring. Lactic acid remained elevated and the patient continued to have orthostatic symptoms following these measures. Admission was requested. Flu negative, urinalysis unremarkable.  - ROS: +Chills, low back pain across the back since he's been laying in bed here. No fevers. Feels lightheaded when standing, drowsy, and remains nauseated but has held down sips of water. Had some blurry vision earlier, but none currently. Denies eye pain, or current headache or visual field defects. No SOB, cough, rash, myalgias, arthralgias, sick contacts, and per HPI. All others reviewed and are negative.  - PMSH: MS Dx last year, under the care of neurology but wants to transfer to  [facility] area, not currently under treatment. Had remote ACL repair surgery, uncomplicated.  - SH: Smoked marijuana last about 2 months ago, no tobacco or EtOH. Employed by [employer].  - Meds: Take no regular medications - Allergy: Hx PCN G allergy.  - FH: +HTN in mother, no MS. Otherwise reviewed and not pertinent.   Physical Exam: Vitals:   03/30/18 0430 03/30/18 0500 03/30/18 0530 03/30/18 0722  BP: 113/62 108/65 (!) 100/53 (!) 99/57  Pulse: 96 92 91 88  Resp: 14 16 15 16   Temp:      TempSrc:      SpO2: 100% 100% 99% 99%  Weight:      Height:       Constitutional: 27 y.o. male in no distress, calm demeanor Eyes: Lids and conjunctivae normal, PERRL ENMT: Mucous membranes are moist. Posterior pharynx clear of any exudate or lesions. Fair dentition.  Neck: normal, supple, no masses, no thyromegaly Respiratory: Non-labored breathing without accessory muscle use. Clear breath sounds to auscultation bilaterally Cardiovascular: Regular rate and rhythm, no murmurs, rubs, or gallops. No carotid bruits. No JVD. No LE edema. 2+ pedal pulses. Abdomen: Normoactive bowel sounds. No tenderness, non-distended, and no masses palpated. No hepatosplenomegaly. GU: No indwelling catheter Musculoskeletal: No clubbing / cyanosis. No joint deformity upper and lower extremities. Good ROM, no contractures. Normal muscle tone.  Skin: Irregular hypopigmentation on right arm/thumb. No acute rashes, wounds, or ulcers. No significant lesions noted.  Neurologic: CN II-XII grossly intact. Gait not assessed, +orthostatic symptoms when rising to seated position in bed. Speech normal. No focal deficits in motor strength or sensation in all extremities.  Psychiatric: Alert and oriented x3. Normal judgment and insight. Mood euthymic with congruent affect.   Labs on Admission: I have personally reviewed following labs and imaging studies  CBC: Recent Labs  Lab 03/30/18 0000  WBC 19.3*  HGB 17.0  HCT 52.4*  MCV 83.4    PLT 269   Basic Metabolic Panel: Recent Labs  Lab 03/30/18 0000  NA 139  K 3.9  CL 102  CO2 24  GLUCOSE 125*  BUN 18  CREATININE 1.41*  CALCIUM 9.6   GFR: Estimated Creatinine Clearance: 80.1 mL/min (A) (by C-G formula based on SCr of 1.41 mg/dL (H)). Liver Function Tests: Recent Labs  Lab 03/30/18 0000  AST 36  ALT 27  ALKPHOS 57  BILITOT 1.7*  PROT 8.1  ALBUMIN 4.9   Recent Labs  Lab 03/30/18 0000  LIPASE 26   No results for input(s): AMMONIA in the last 168 hours. Coagulation Profile: No results for input(s): INR, PROTIME in the last 168 hours. Cardiac Enzymes: No results for input(s): CKTOTAL, CKMB, CKMBINDEX, TROPONINI in the last 168 hours. BNP (last 3 results) No results for input(s): PROBNP in the last 8760 hours. HbA1C: No results for input(s): HGBA1C in the last 72 hours. CBG: No results for input(s): GLUCAP in the last 168 hours. Lipid Profile: No results for input(s): CHOL, HDL, LDLCALC, TRIG, CHOLHDL, LDLDIRECT in the last 72 hours. Thyroid Function Tests: No results for input(s): TSH, T4TOTAL, FREET4, T3FREE, THYROIDAB in the last 72 hours. Anemia Panel: No results for input(s): VITAMINB12, FOLATE, FERRITIN, TIBC, IRON, RETICCTPCT in the last 72 hours. Urine analysis:    Component Value Date/Time   COLORURINE STRAW (A) 03/30/2018 0423   APPEARANCEUR CLEAR 03/30/2018 0423   LABSPEC 1.030 03/30/2018 0423   PHURINE 6.0 03/30/2018 0423   GLUCOSEU NEGATIVE 03/30/2018 0423   HGBUR NEGATIVE 03/30/2018 0423   BILIRUBINUR NEGATIVE 03/30/2018 0423   KETONESUR NEGATIVE 03/30/2018 0423   PROTEINUR NEGATIVE 03/30/2018 0423   NITRITE NEGATIVE 03/30/2018 0423   LEUKOCYTESUR NEGATIVE 03/30/2018 0423    Recent Results (from the past 240 hour(s))  Blood Culture (routine x 2)     Status: None (Preliminary result)   Collection Time: 03/30/18 12:54 AM  Result Value Ref Range Status   Specimen Description   Final    BLOOD LEFT HAND Performed at [facility], [address] [place], [place] [address]    Special Requests   Final    BOTTLES DRAWN AEROBIC AND ANAEROBIC Blood Culture results may not be optimal due to an inadequate volume of blood received in culture bottles Performed at [facility], [address] [place], [place] [address]    Culture   Final    NO GROWTH < 12 HOURS Performed at [facility], [address] [place], [place] [address]    Report Status PENDING  Incomplete  Blood Culture (routine x 2)     Status: None (Preliminary result)   Collection Time: 03/30/18 12:54 AM  Result Value Ref Range Status   Specimen Description   Final    BLOOD LEFT ANTECUBITAL Performed at [facility], [address], [place], [place] [address]    Special Requests   Final    BOTTLES DRAWN AEROBIC AND ANAEROBIC Blood Culture adequate volume Performed at [facility], [address] [place], [place] [address]    Culture   Final    NO GROWTH < 12 HOURS Performed at [facility], [address] [place], [place] [address]    Report Status PENDING  Incomplete     Radiological Exams on Admission: Ct Abdomen Pelvis W Contrast  Result Date: 03/30/2018 CLINICAL DATA:  Acute onset of generalized abdominal pain and nausea. Vomiting. Personal history of multiple sclerosis. EXAM: CT ABDOMEN AND PELVIS WITH CONTRAST TECHNIQUE: Multidetector CT imaging of the abdomen and pelvis was performed using the standard protocol following bolus administration of intravenous contrast. CONTRAST:  100mL ISOVUE-300 IOPAMIDOL (ISOVUE-300) INJECTION 61% COMPARISON:  None. FINDINGS: Lower chest: The visualized lung bases are grossly clear. The visualized portions of the mediastinum are unremarkable. Hepatobiliary: The liver is unremarkable in appearance. The gallbladder is unremarkable in appearance. The common bile duct remains normal in caliber. Pancreas: An apparent 6 mm cystic focus  at the pancreatic tail is likely benign, given the patient's age. Spleen: The spleen is unremarkable in appearance. Adrenals/Urinary Tract: The adrenal glands are unremarkable in appearance. The kidneys are within normal limits. There is no evidence of hydronephrosis. No renal or ureteral stones are identified. No perinephric stranding is seen. Stomach/Bowel: The stomach is unremarkable in appearance. The small bowel is within normal limits. The appendix is normal in caliber, without evidence of appendicitis. The colon is unremarkable in appearance. Vascular/Lymphatic: The abdominal aorta is unremarkable in appearance. The inferior vena cava is grossly unremarkable. No retroperitoneal lymphadenopathy is seen. No pelvic sidewall lymphadenopathy is identified. Reproductive: The bladder is decompressed and not well characterized. The prostate is normal in size. Other: No additional soft tissue abnormalities are seen. Musculoskeletal: No acute osseous abnormalities are identified. The visualized musculature is unremarkable in appearance. IMPRESSION: 1. No acute abnormality seen within the abdomen or pelvis. 2. Apparent 6 mm cystic focus at the pancreatic tail is likely benign, given the patient's age. However, follow-up MRCP would be helpful in 12 months to ensure stability, as deemed clinically appropriate. Electronically Signed   By: Roanna RaiderJeffery  Chang M.D.   On: 03/30/2018 01:17   Assessment/Plan Active Problems:   MS (multiple sclerosis) (HCC)   Nausea & vomiting   AKI (acute kidney injury) (HCC)   Gastroenteritis   Dehydration   Nausea and vomiting presumed due to gastroenteritis causing dehydration and AKI: CT abdomen/pelvis reassuring. Flu and UA negative.  - Remains orthostatic despite 30cc/kg IVF's. Will recheck orthostatics and continue IVF's with LR @ 125cc/hr.  - Full liquid diet, support with IV antiemetic prn - Recheck BMP and CBC in AM. - No bacterial nidus of infection found, so will stop abx  for now.  Multiple sclerosis:  - Not under active treatment. Monitor closely for flare/Uhthoff's phenomenon. - Follow up with neurology as outpatient.   Pancreatic tail cystic lesion: ~986mm noted incidentally.  - Discussed with patient and mother the recommendation to have this rescanned in 12 months for surveillance.  DVT prophylaxis: Lovenox  Code Status: Full  Family Communication: Mother at bedside Disposition Plan: Home once clinically stable Consults called: None  Admission status: Observation. Due to untreated MS, pt at high risk for decompensation if remains dehydrated. Has not proven ability to take sufficient po at time of admission.     Tyrone Nineyan B Trica Usery, MD [facility] [email or web address] Password [email or web address] 03/30/2018, 8:34 AM

## 2018-03-31 DIAGNOSIS — G35 Multiple sclerosis: Secondary | ICD-10-CM | POA: Diagnosis not present

## 2018-03-31 DIAGNOSIS — E872 Acidosis, unspecified: Secondary | ICD-10-CM

## 2018-03-31 DIAGNOSIS — E860 Dehydration: Secondary | ICD-10-CM | POA: Diagnosis not present

## 2018-03-31 DIAGNOSIS — N179 Acute kidney failure, unspecified: Secondary | ICD-10-CM | POA: Diagnosis not present

## 2018-03-31 DIAGNOSIS — K529 Noninfective gastroenteritis and colitis, unspecified: Secondary | ICD-10-CM | POA: Diagnosis not present

## 2018-03-31 LAB — BASIC METABOLIC PANEL
ANION GAP: 7 (ref 5–15)
BUN: 7 mg/dL (ref 6–20)
CALCIUM: 8.5 mg/dL — AB (ref 8.9–10.3)
CO2: 25 mmol/L (ref 22–32)
Chloride: 107 mmol/L (ref 98–111)
Creatinine, Ser: 1.09 mg/dL (ref 0.61–1.24)
GFR calc Af Amer: >60 mL/min (ref >60)
Glucose, Bld: 95 mg/dL (ref 70–99)
POTASSIUM: 3.8 mmol/L (ref 3.5–5.1)
Sodium: 139 mmol/L (ref 135–145)

## 2018-03-31 LAB — CBC
HCT: 45 % (ref 39.0–52.0)
HEMOGLOBIN: 14.2 g/dL (ref 13.0–17.0)
MCH: 26.8 pg (ref 26.0–34.0)
MCHC: 31.6 g/dL (ref 30.0–36.0)
MCV: 85.1 fL (ref 80.0–100.0)
Platelets: 196 10*3/uL (ref 150–400)
RBC: 5.29 MIL/uL (ref 4.22–5.81)
RDW: 12.4 % (ref 11.5–15.5)
WBC: 7.6 10*3/uL (ref 4.0–10.5)
nRBC: 0 % (ref 0.0–0.2)

## 2018-03-31 NOTE — Discharge Summary (Signed)
Discharge Summary  Juan DillMyles J Kulkarni ZOX:[id number] DOB: December 13, 1990  PCP: Patient, No Pcp Per  Admit date: 03/29/2018 Discharge date: 03/31/2018  Time spent: 30mins  Recommendations for Outpatient Follow-up:  1. F/u with PCP within a week  for hospital discharge follow up, repeat cbc/bmp at follow up. pcp to follow up on pancreatic cyst 2. F/u with neurology for MS  Discharge Diagnoses:  Active Hospital Problems   Diagnosis Date Noted  . Nausea & vomiting 03/30/2018  . AKI (acute kidney injury) (HCC) 03/30/2018  . Gastroenteritis 03/30/2018  . Dehydration 03/30/2018  . MS (multiple sclerosis) (HCC) 09/18/2016    Resolved Hospital Problems  No resolved problems to display.    Discharge Condition: stable  Diet recommendation: regular diet  Filed Weights   03/30/18 0300 03/30/18 1100  Weight: 74.8 kg 74.3 kg    History of present illness: (per admitting MD Dr Jarvis NewcomerGrunz) PCP: Patient, No Pcp Per Outpatient Specialists: Neurology, Dr. Langston MaskerMorris  Patient coming from: Home  Chief Complaint: Nausea, vomiting  HPI: Juan Mahoney is a 27 y.o. male with a history of multiple sclerosis who presented to the ED with several hours of nausea and vomiting. He awoke from a nap yesterday at 3:30pm with a headache on both sides of the head that was mild-moderate, not unusual for him, not associated with visual changes. He ate a meal around 5:00pm and went to his cousin's house where he began having generalized abdominal cramping, nausea that was constant, severe, and ultimately causing NBNB vomiting. He also had an episode of loose stool without blood in it. Nausea persisted despite resting so his mother brought him to the ED.  ED Course: In the waiting room he continued to have nausea and vomiting and loose stools with his "stomach turning." In the ED room he was found to be hypotensive, tachycardic with lactic acid elevation to 3.5, WBC 19.3k, creatinine up to 1.41. 30cc'kg IVFs and  ceftriaxone, flagyl were provided for presumptive sepsis due to intraabdominal infection. CT abdomen, however, was reassuring. Lactic acid remained elevated and the patient continued to have orthostatic symptoms following these measures. Admission was requested. Flu negative, urinalysis unremarkable.  Hospital Course:  Active Problems:   MS (multiple sclerosis) (HCC)   Nausea & vomiting   AKI (acute kidney injury) (HCC)   Gastroenteritis   Dehydration  N/V/abdominal pain - with leukocytosis, lactic acidosis, aki on presentation -CT ab /pel no acute process -blood culture no growth -symptom resolve with hydration, suspect viral gastroenteritis -d/c home , follow up with pcp.  Leukocytosis/lactic acidosis/AKI : likely due to dehydration, resolved.  Multiple sclerosis -stable, no weakness at time of discharge -f/u with neurology  Pancreatic tail cystic lesion: ~646mm noted incidentally on CT ab/pel pcp follow up, consider mrcp in 12months   Procedures:  none  Consultations:  none  Discharge Exam: BP 111/74 (BP Location: Left Arm)   Pulse 71   Temp 99.3 F (37.4 C) (Oral)   Resp 20   Ht 5\' 9"  (1.753 m)   Wt 74.3 kg   SpO2 100%   BMI 24.19 kg/m   General: NAD Cardiovascular: RRR Respiratory: CTABL  Discharge Instructions You were cared for by a hospitalist during your hospital stay. If you have any questions about your discharge medications or the care you received while you were in the hospital after you are discharged, you can call the unit and asked to speak with the hospitalist on call if the hospitalist that took care of you is not  available. Once you are discharged, your primary care physician will handle any further medical issues. Please note that NO REFILLS for any discharge medications will be authorized once you are discharged, as it is imperative that you return to your primary care physician (or establish a relationship with a primary care physician if you do  not have one) for your aftercare needs so that they can reassess your need for medications and monitor your lab values.  Discharge Instructions    Diet general   Complete by:  As directed    Increase activity slowly   Complete by:  As directed      Allergies as of 03/31/2018      Reactions   Penicillin G Rash   DID THE REACTION INVOLVE: Swelling of the face/tongue/throat, SOB, or low BP? No Sudden or severe rash/hives, skin peeling, or the inside of the mouth or nose? Yes Did it require medical treatment? No When did it last happen?2015 If all above answers are "NO", may proceed with cephalosporin use.      Medication List    You have not been prescribed any medications.    Allergies  Allergen Reactions  . Penicillin G Rash    DID THE REACTION INVOLVE: Swelling of the face/tongue/throat, SOB, or low BP? No Sudden or severe rash/hives, skin peeling, or the inside of the mouth or nose? Yes Did it require medical treatment? No When did it last happen?2015 If all above answers are "NO", may proceed with cephalosporin use.    Follow-up Information    [facility] Follow up.   Contact information: [address]     [address] [place] [address] [phone]       f/u with pcp Follow up in 1 week(s).   Why:  for hospital discharge follow up. repeat cbc/bmp at follow up.  MRCP would be helpful in 12 months to ensure stability of 6 mm cystic focus at the pancreatic tail            The results of significant diagnostics from this hospitalization (including imaging, microbiology, ancillary and laboratory) are listed below for reference.    Significant Diagnostic Studies: Ct Abdomen Pelvis W Contrast  Result Date: 03/30/2018 CLINICAL DATA:  Acute onset of generalized abdominal pain and nausea. Vomiting. Personal history of multiple sclerosis. EXAM: CT ABDOMEN AND PELVIS WITH CONTRAST TECHNIQUE: Multidetector CT imaging  of the abdomen and pelvis was performed using the standard protocol following bolus administration of intravenous contrast. CONTRAST:  ISOVUE-300 IOPAMIDOL (ISOVUE-300) INJECTION 61% COMPARISON:  None. FINDINGS: Lower chest: The visualized lung bases are grossly clear. The visualized portions of the mediastinum are unremarkable. Hepatobiliary: The liver is unremarkable in appearance. The gallbladder is unremarkable in appearance. The common bile duct remains normal in caliber. Pancreas: An apparent 6 mm cystic focus at the pancreatic tail is likely benign, given the patient's age. Spleen: The spleen is unremarkable in appearance. Adrenals/Urinary Tract: The adrenal glands are unremarkable in appearance. The kidneys are within normal limits. There is no evidence of hydronephrosis. No renal or ureteral stones are identified. No perinephric stranding is seen. Stomach/Bowel: The stomach is unremarkable in appearance. The small bowel is within normal limits. The appendix is normal in caliber, without evidence of appendicitis. The colon is unremarkable in appearance. Vascular/Lymphatic: The abdominal aorta is unremarkable in appearance. The inferior vena cava is grossly unremarkable. No retroperitoneal lymphadenopathy is seen. No pelvic sidewall lymphadenopathy is identified. Reproductive: The bladder is decompressed and not  well characterized. The prostate is normal in size. Other: No additional soft tissue abnormalities are seen. Musculoskeletal: No acute osseous abnormalities are identified. The visualized musculature is unremarkable in appearance. IMPRESSION: 1. No acute abnormality seen within the abdomen or pelvis. 2. Apparent 6 mm cystic focus at the pancreatic tail is likely benign, given the patient's age. However, follow-up MRCP would be helpful in 12 months to ensure stability, as deemed clinically appropriate. Electronically Signed   By: Roanna Raider M.D.   On: 03/30/2018 01:17     Microbiology: Recent Results (from the past 240 hour(s))  Blood Culture (routine x 2)     Status: None (Preliminary result)   Collection Time: 03/30/18 12:54 AM  Result Value Ref Range Status   Specimen Description   Final    BLOOD LEFT HAND Performed at [facility], [address] [place], [place] [address]    Special Requests   Final    BOTTLES DRAWN AEROBIC AND ANAEROBIC Blood Culture results may not be optimal due to an inadequate volume of blood received in culture bottles Performed at [facility], [address], [place], [place] [address]    Culture   Final    NO GROWTH 1 DAY Performed at [facility], [address] [place], [place] [address]    Report Status PENDING  Incomplete  Blood Culture (routine x 2)     Status: None (Preliminary result)   Collection Time: 03/30/18 12:54 AM  Result Value Ref Range Status   Specimen Description   Final    BLOOD LEFT ANTECUBITAL Performed at [facility], [address] [place], [place] [address]    Special Requests   Final    BOTTLES DRAWN AEROBIC AND ANAEROBIC Blood Culture adequate volume Performed at [facility], [address], [place], [place] [address]    Culture   Final    NO GROWTH 1 DAY Performed at [facility], [address] [place], [place] [address]    Report Status PENDING  Incomplete     Labs: Basic Metabolic Panel: Recent Labs  Lab 03/30/18 0000 03/31/18 0802  NA 139 139  K 3.9 3.8  CL 102 107  CO2 24 25  GLUCOSE 125* 95  BUN 18 7  CREATININE 1.41* 1.09  CALCIUM 9.6 8.5*   Liver Function Tests: Recent Labs  Lab 03/30/18 0000  AST 36  ALT 27  ALKPHOS 57  BILITOT 1.7*  PROT 8.1  ALBUMIN 4.9   Recent Labs  Lab 03/30/18 0000  LIPASE 26   No results for input(s): AMMONIA in the last 168 hours. CBC: Recent Labs  Lab 03/30/18 0000 03/31/18 0802  WBC 19.3* 7.6  HGB 17.0 14.2  HCT 52.4* 45.0  MCV  83.4 85.1  PLT 269 196   Cardiac Enzymes: No results for input(s): CKTOTAL, CKMB, CKMBINDEX, TROPONINI in the last 168 hours. BNP: BNP (last 3 results) No results for input(s): BNP in the last 8760 hours.  ProBNP (last 3 results) No results for input(s): PROBNP in the last 8760 hours.  CBG: No results for input(s): GLUCAP in the last 168 hours.     Signed:  Albertine Grates MD, PhD  [facility] 03/31/2018, 12:08 PM

## 2018-04-01 LAB — HIV ANTIBODY (ROUTINE TESTING W REFLEX): HIV Screen 4th Generation wRfx: NONREACTIVE

## 2018-04-04 LAB — CULTURE, BLOOD (ROUTINE X 2)
Culture: NO GROWTH
Culture: NO GROWTH
Special Requests: ADEQUATE

## 2018-04-16 ENCOUNTER — Ambulatory Visit: Payer: Managed Care, Other (non HMO) | Admitting: Neurology

## 2018-04-16 ENCOUNTER — Encounter: Payer: Self-pay | Admitting: Neurology

## 2018-04-16 VITALS — BP 107/67 | HR 68 | Ht 69.0 in | Wt 160.0 lb

## 2018-04-16 DIAGNOSIS — G373 Acute transverse myelitis in demyelinating disease of central nervous system: Secondary | ICD-10-CM

## 2018-04-16 DIAGNOSIS — F988 Other specified behavioral and emotional disorders with onset usually occurring in childhood and adolescence: Secondary | ICD-10-CM | POA: Diagnosis not present

## 2018-04-16 DIAGNOSIS — G35 Multiple sclerosis: Secondary | ICD-10-CM

## 2018-04-16 DIAGNOSIS — Z79899 Other long term (current) drug therapy: Secondary | ICD-10-CM | POA: Diagnosis not present

## 2018-04-16 MED ORDER — AMPHETAMINE-DEXTROAMPHET ER 20 MG PO CP24: 20.0000 mg | ORAL_CAPSULE | Freq: Every day | ORAL | 0 refills | Status: DC

## 2018-04-16 NOTE — Progress Notes (Signed)
[facility]  PATIENT: Juan Mahoney DOB: 06/12/1990  REFERRING DOCTOR OR PCP:   Meredith Staggers, MD; Dr. Gaynelle Adu, [facility] SOURCE: Patient, notes from Dr. Gaynelle Adu and Dr. Neva Seat, imaging and laboratory reports  _________________________________   HISTORICAL  CHIEF COMPLAINT:  Chief Complaint  Patient presents with  . New Patient (Initial Visit)    RM 15 with parents. Hospital f/u for MS. Was at [facility]. Denies any vision problems, weakness, numbness. No gait/ambulation problems.   . Multiple Sclerosis    Dx in 04/2016. Not on DMT. Has not been on any in the past either. Does have great uncle with MS. No other family hx.    HISTORY OF PRESENT ILLNESS:  I had the pleasure seeing your patient, Juan Mahoney, at the MS center at [facility] for neurologic consultation regarding his multiple loose sclerosis and recent hospitalization.   He was hospitalized recently for a viral syndrome  He is a 28 yo man who was diagnosed with MS after presenmting with right arm numbness in 2018.    He went to the ED and an MRI showed a spinal cord lesion at C1C2.  Follow up brain MRI confirmed multiple lesions in the brain and Thoracic spine.   He was referred to Dr. Gaynelle Adu at [facility] and diagnosed with MS.    Currently, he denies any problems with gait, balance, weakness or numbness.   Two months ago, he had diplopia but this lasted a few minutes only.   No visual loss or color changes.     He denies bladder issues.       He has a lot of fatigue and sleepiness, sometimes falling asleep in his college classes.    This started 6 years ago.    He sleeps 8-10 hours most nights.   He snores softly but no snorts, gasps or pauses.   He denies depression and anxiety but his parents feel he has both of these.    He feels cognition is doing well though he sometimes has poor attention and focus.   He was diagnosed with ADD as an older child and teen.     I reviewed  the report from the MRIs from 04/07/2016.  The MRI of the brain showed multiple white matter lesions within the periventricular white matter of the hemispheres and also a lesion in the right internal capsule, right thalamus and left lateral medulla.  There is no definite enhancement noted.   MRI of the cervical spine showed a nonenhancing T2 hyperintense focus at C1-C2.  MRI of the thoracic spine showed a T2 hyperintense lesion on the left adjacent to T4.  It did not enhance.  He has a great uncle (paternal side) with MS who recently passed away. He had severe impairments but was diagnosed in the 1980's.     REVIEW OF SYSTEMS: Constitutional: No fevers, chills, sweats, or change in appetite Eyes: No visual changes, double vision, eye pain Ear, nose and throat: No hearing loss, ear pain, nasal congestion, sore throat Cardiovascular: No chest pain, palpitations Respiratory: No shortness of breath at rest or with exertion.   No wheezes GastrointestinaI: No nausea, vomiting, diarrhea, abdominal pain, fecal incontinence Genitourinary: No dysuria, urinary retention or frequency.  No nocturia. Musculoskeletal: No neck pain, back pain Integumentary: No rash, pruritus, skin lesions Neurological: as above Psychiatric: No depression at this time.  No anxiety Endocrine: No palpitations, diaphoresis, change in appetite, change in weigh or increased thirst Hematologic/Lymphatic: No  anemia, purpura, petechiae. Allergic/Immunologic: No itchy/runny eyes, nasal congestion, recent allergic reactions, rashes  ALLERGIES: Allergies  Allergen Reactions  . Penicillin G Rash    DID THE REACTION INVOLVE: Swelling of the face/tongue/throat, SOB, or low BP? No Sudden or severe rash/hives, skin peeling, or the inside of the mouth or nose? Yes Did it require medical treatment? No When did it last happen?2015 If all above answers are "NO", may proceed with cephalosporin use.     HOME MEDICATIONS: No  current outpatient medications on file.  PAST MEDICAL HISTORY: Past Medical History:  Diagnosis Date  . Multiple sclerosis (HCC)    Dx 2018    PAST SURGICAL HISTORY: Past Surgical History:  Procedure Laterality Date  . ARTHROSCOPIC REPAIR ACL Right     FAMILY HISTORY: Family History  Problem Relation Age of Onset  . Hypertension Mother   . Cancer Maternal Grandmother     SOCIAL HISTORY:  Social History   Socioeconomic History  . Marital status: Single    Spouse name: Not on file  . Number of children: Not on file  . Years of education: Not on file  . Highest education level: Not on file  Occupational History  . Not on file  Social Needs  . Financial resource strain: Not on file  . Food insecurity:    Worry: Not on file    Inability: Not on file  . Transportation needs:    Medical: Not on file    Non-medical: Not on file  Tobacco Use  . Smoking status: Never Smoker  . Smokeless tobacco: Never Used  Substance and Sexual Activity  . Alcohol use: No  . Drug use: Yes    Types: Marijuana  . Sexual activity: Not Currently  Lifestyle  . Physical activity:    Days per week: Not on file    Minutes per session: Not on file  . Stress: Not on file  Relationships  . Social connections:    Talks on phone: Not on file    Gets together: Not on file    Attends religious service: Not on file    Active member of club or organization: Not on file    Attends meetings of clubs or organizations: Not on file    Relationship status: Not on file  . Intimate partner violence:    Fear of current or ex partner: Not on file    Emotionally abused: Not on file    Physically abused: Not on file    Forced sexual activity: Not on file  Other Topics Concern  . Not on file  Social History Narrative   Lives   Caffeine use: Soda sometimes    Right handed      PHYSICAL EXAM  Vitals:   04/16/18 1327  BP: 107/67  Pulse: 68  Weight: 160 lb (72.6 kg)  Height: 5\' 9"  (1.753 m)     Body mass index is 23.63 kg/m.   General: The patient is well-developed and well-nourished and in no acute distress  Eyes:  Funduscopic exam shows normal optic discs and retinal vessels.  Neck: The neck is supple, no carotid bruits are noted.  The neck is nontender.  Cardiovascular: The heart has a regular rate and rhythm with a normal S1 and S2. There were no murmurs, gallops or rubs. Lungs are clear to auscultation.  Skin: Extremities are without significant edema.  Musculoskeletal:  Back is nontender  Neurologic Exam  Mental status: The patient is alert and oriented x 3  at the time of the examination. The patient has apparent normal recent and remote memory, with an apparently normal attention span and concentration ability.   Speech is normal.  Cranial nerves: Extraocular movements are full. Pupils are equal, round, and reactive to light and accomodation.  Visual fields are full.  Facial symmetry is present. There is good facial sensation to soft touch bilaterally.Facial strength is normal.  Trapezius and sternocleidomastoid strength is normal. No dysarthria is noted.  The tongue is midline, and the patient has symmetric elevation of the soft palate. No obvious hearing deficits are noted.  Motor:  Muscle bulk is normal.   Tone is normal. Strength is  5 / 5 in all 4 extremities.   Sensory: Sensory testing is intact to pinprick, soft touch and vibration sensation in all 4 extremities.  Coordination: Cerebellar testing reveals good finger-nose-finger and heel-to-shin bilaterally.  Gait and station: Station is normal.   Gait is normal. Tandem gait is normal. Romberg is negative.   Reflexes: Deep tendon reflexes are symmetric but increased with crossed adductor responses at the knees.   No ankle clonus.   Plantar responses are flexor.    DIAGNOSTIC DATA (LABS, IMAGING, TESTING) - I reviewed patient records, labs, notes, testing and imaging myself where available.  Lab  Results  Component Value Date   WBC 7.6 03/31/2018   HGB 14.2 03/31/2018   HCT 45.0 03/31/2018   MCV 85.1 03/31/2018   PLT 196 03/31/2018      Component Value Date/Time   NA 139 03/31/2018 0802   K 3.8 03/31/2018 0802   CL 107 03/31/2018 0802   CO2 25 03/31/2018 0802   GLUCOSE 95 03/31/2018 0802   BUN 7 03/31/2018 0802   CREATININE 1.09 03/31/2018 0802   CALCIUM 8.5 (L) 03/31/2018 0802   PROT 8.1 03/30/2018 0000   ALBUMIN 4.9 03/30/2018 0000   AST 36 03/30/2018 0000   ALT 27 03/30/2018 0000   ALKPHOS 57 03/30/2018 0000   BILITOT 1.7 (H) 03/30/2018 0000   GFRNONAA >60 03/31/2018 0802   GFRAA >60 03/31/2018 0802       ASSESSMENT AND PLAN  Multiple sclerosis (HCC) - Plan: Varicella zoster antibody, IgG, Cytochrome P450 2C9 Genotyping, MR BRAIN W WO CONTRAST, MR CERVICAL SPINE W WO CONTRAST  MS (multiple sclerosis) (HCC)  Transverse myelitis (HCC)  Attention deficit disorder, unspecified hyperactivity presence  High risk medication use - Plan: Varicella zoster antibody, IgG, Cytochrome P450 2C9 Genotyping   In summary, Juan Mahoney is a 28 year old man who was diagnosed with MS in 2018 but never started a disease modifying therapy.  He has a recent emergency room visit and was referred for further evaluation and management.   Of note, he does have 2 spinal plaques.  That combined with demographics have been an African-American male put him at a higher risk for a more aggressive disease course.  However, he has not had an exacerbation in over a year and had a complete recovery from his first exacerbation which likely puts him back into average risk.  We discussed the different disease modifying therapies.  He does not think he would do a self injectable medicine and is most interested in an oral agent.  He has had some compliance issues and feels he would do best with a once a day medicine.  We discussed options.  Due to his potentially more aggressive prognosis I recommended  Mayzent.  Last year he had an EKG which was normal.  We will check  a varicella zoster virus IgG to make sure that he is immune and also check the cytochrome P450 2C9 gene to make sure that he will metabolize the drug properly.  Other labs were performed during his recent hospital stay.  I will also check an MRI of the brain and cervical spine to determine the amount of subclinical progression that has occurred since his previous neurology visits and consider a more efficacious IV medication if there has been a lot of change.   He also has attention deficit disorder, fatigue and sleepiness.  I will have him try Adderall XR 20 mg every morning and adjust the dose if needed.  He will return to see me in 3 months or sooner if there are new or worsening neurologic symptoms.  Thank you for asking me to see Juan Mahoney.  Please let me know if I can be of further assistance with him or other patients in the future.   call [phone] to schedule the MRI   A. Epimenio FootSater, MD, [facility],FAAN 04/16/2018, 1:48 PM Certified in Neurology, Clinical Neurophysiology, Sleep Medicine, Pain Medicine and Neuroimaging  [facility] [address], [address] [place], [place] [address] [phone]

## 2018-04-17 ENCOUNTER — Telehealth: Payer: Self-pay | Admitting: Neurology

## 2018-04-17 NOTE — Telephone Encounter (Signed)
[employer] order sent to GI they are aware. Also GI obtains the auth and will reach out to the pt to schedule.

## 2018-04-23 LAB — CYTOCHROME P450 2C9 GENOTYPING

## 2018-04-23 LAB — VARICELLA ZOSTER ANTIBODY, IGG: Varicella zoster IgG: >4000 index (ref >165)

## 2018-04-24 ENCOUNTER — Telehealth: Payer: Self-pay | Admitting: *Deleted

## 2018-04-24 NOTE — Telephone Encounter (Signed)
Faxed completed Mayzent start form to [phone]. Received fax confirmation. Pt has had pre-screening labs, EKG and eye exam per Dr. Epimenio Foot.

## 2018-04-25 ENCOUNTER — Telehealth: Payer: Self-pay | Admitting: *Deleted

## 2018-04-25 NOTE — Telephone Encounter (Signed)
Pt. must call [employer], to register for specialty pharmacy benefits.  Phone# [phone]/fim

## 2018-04-25 NOTE — Telephone Encounter (Signed)
Spoke with father and relayed that pt. needs to call [employer] to register for sp. pharm. benefits, at # provided. Father sts. they will take care of it./fim

## 2018-04-25 NOTE — Telephone Encounter (Signed)
PA for Mayzent 2mg  tablets, #30/30 completed by phone with [facility], phone# [phone].  Dx: RRMS (G35). No tried and faileds. Contraindicated meds: Copaxone, Avonex, Plegridy, Betaseron, Glatiramer, Rebif, Glatopa.  PA approved for dates 03/26/18-04/25/19. PA# [id number]/fim

## 2018-04-25 NOTE — Telephone Encounter (Signed)
LMOM at both home and cell #'s  for pt. to call.  Mayzent has been approved, but he must call Sherre Lain, a part of his ins. plan, to register for specialty pharmacy benefits. The # he needs to call is [phone]/fim

## 2018-04-26 MED ORDER — ALPRAZOLAM 1 MG PO TABS: ORAL_TABLET | ORAL | 0 refills | Status: DC

## 2018-04-26 NOTE — Addendum Note (Signed)
Addended by: Levert Feinstein on: 04/26/2018 11:59 AM   Modules accepted: Orders

## 2018-04-26 NOTE — Telephone Encounter (Signed)
Pt mother(on DPR) is calling back in response to message from RN Faith on yesterday.  Pt mother is also asking for a call back to request something be called in for pt for MRI on tomorrow 01-25 Pt mother would like to use  [facility] [phone]

## 2018-04-26 NOTE — Addendum Note (Signed)
Addended by: Lindell Spar C on: 04/26/2018 11:55 AM   Modules accepted: Orders

## 2018-04-26 NOTE — Telephone Encounter (Addendum)
Dr. Epimenio Foot is out of the office.  Dr. Terrace Arabia authorized and electronically sent a prescription for alprazolam 1mg , take 1-2 tablets thirty minutes prior to MRI.  May take one additional tablet before entering scanner, if needed. Must have driver.  Returned the call to the patient's mother.  Left message (ok per DPR) that the above medication has been sent to the pharmacy.

## 2018-04-27 ENCOUNTER — Ambulatory Visit
Admission: RE | Admit: 2018-04-27 | Discharge: 2018-04-27 | Disposition: A | Discharge status: Home / Self Care | Payer: 59 | Source: Ambulatory Visit | Attending: Neurology | Admitting: Neurology

## 2018-04-27 DIAGNOSIS — G35 Multiple sclerosis: Secondary | ICD-10-CM

## 2018-04-27 MED ORDER — GADOBENATE DIMEGLUMINE 529 MG/ML IV SOLN
15.0000 mL | Freq: Once | INTRAVENOUS | Status: AC | PRN
  Administered 2018-04-27: 15 mL via INTRAVENOUS

## 2018-04-29 ENCOUNTER — Telehealth: Payer: Self-pay | Admitting: *Deleted

## 2018-04-29 NOTE — Telephone Encounter (Signed)
Mother called back. I relayed results per Dr. Epimenio Foot note. Also advised PA approved and we cleared for therapy. They should be getting call to set up shipment next. She verbalized understanding.

## 2018-04-29 NOTE — Telephone Encounter (Signed)
-----   Message from Asa Lente, MD sent at 04/28/2018 12:25 PM EST ----- Please let him know I took a look at the MRI's     he has a couple spots in the cervical spinal cord and typical looking MRI spots in the brain.  Nothing looked brand-new.  At the last visit we discussed treatments and he is going to start Mayzent.

## 2018-04-29 NOTE — Telephone Encounter (Signed)
Called, LVM for pt mother (on [place]) to call about results.

## 2018-04-30 ENCOUNTER — Telehealth: Payer: Self-pay | Admitting: Neurology

## 2018-04-30 NOTE — Telephone Encounter (Signed)
Called and spoke with pt mother, Juan Mahoney. She knows Toniann Fail has been trying reach her. She will call her back to set up a time for her son to talk with her. Son does not have cell phone. Been hard for them to set a time to talk. She will try and get this done. She will call me back if she has further questions/concerns.

## 2018-04-30 NOTE — Telephone Encounter (Signed)
Juan Mahoney with reimbursements for Mayzent has called for RN Juan Mahoney to make her aware RN Juan Mahoney has made 5 attempts to call pt re: the start of their medication with no success of reaching pt.  Juan Mahoney called [employer] to inquire about other telephone #'s and possible email to try an reach pt.  Juan Mahoney is asking the RN Juan Mahoney calls their RN Juan Mahoney @ [phone] °

## 2018-05-06 ENCOUNTER — Telehealth: Payer: Self-pay | Admitting: Neurology

## 2018-05-06 NOTE — Telephone Encounter (Signed)
I called Juan Mahoney back. Advised Dr. Epimenio Foot stated pt already had pre-screening labs, EKG and eye exam. He is cleared for treatment. She verbalized understanding. Nothing further needed.

## 2018-05-06 NOTE — Telephone Encounter (Signed)
Michelle from [facility] called to inform us that Pt has been cleared for therapy but the mother states pt has yet to have eye exam. It is scheduled for 2/4. Would you like medication held till then ? Please advise.

## 2018-05-06 NOTE — Telephone Encounter (Signed)
Called Aurora back. LVM letting her know to hold medication until pt has eye exam. Gave [employer] phone number if she has further questions.

## 2018-05-06 NOTE — Telephone Encounter (Signed)
Carley/Mayzent [phone] states the pt has labs scheduled on the 4th she is not sure where. She needs clarification that the patient is cleared to start the medication so it can be dispensed.

## 2018-05-08 NOTE — Telephone Encounter (Signed)
Spoke with Juan Mahoney and his mother.  He was scheduled for his eye exam yesterday, in preparation for starting Mayzent. Pt. sts. he cancelled appt. due to basketball injury. Mother sts. he is now scheduled for his eye exam on 05/18/18./fim

## 2018-05-20 NOTE — Telephone Encounter (Signed)
I called and spoke with pt father (on [place]). He states son did not have eye exam on 05/18/18 as planned. He moved to [facility] for a new job opportunity. He has eye exam scheduled for 05/25/18 at [facility] in [place], [place] at 11am. He will have them fax office notes to [place] after hes seen at [phone].  I expressed importance of him having this done so he can start on DMT asap for MS.

## 2018-05-22 ENCOUNTER — Telehealth: Payer: Self-pay | Admitting: Neurology

## 2018-05-22 NOTE — Telephone Encounter (Signed)
Spoke with Dr. Epimenio Foot. Does not sound MS related. He wants him to monitor his sx. If they do not improve by Monday, he should call back and he can call him in a muscle relaxer

## 2018-05-22 NOTE — Telephone Encounter (Signed)
I called father back. This morning around 3-4am, son started having severe back pain. Stomach hurts when he lays on it. He has been in constant pain since this this am. Denies any signs of infection. No temperature. Denies falls. No vision changes. No weakness. Has difficulty walking d/t pain only. Advised I will discuss with Dr. Epimenio Foot and call back with recommendation. He verbalized understanding.

## 2018-05-22 NOTE — Telephone Encounter (Signed)
Pt dad(on DPR) has called, he is asking for a call from RN to discuss possible relapse pt is having.  Pt dad states pt has been complaining about back pain.  Please call

## 2018-05-22 NOTE — Telephone Encounter (Signed)
I called and spoke with father. Relayed message below. He verbalized understanding.

## 2018-05-25 ENCOUNTER — Encounter: Payer: Self-pay | Admitting: Neurology

## 2018-05-28 NOTE — Telephone Encounter (Signed)
I called father to confirm son saw eye doctor 05/25/18. We have not received faxed office notes from visit yet. I LVM for him to call back and discuss. Gave [facility] phone number

## 2018-05-29 NOTE — Telephone Encounter (Signed)
I received office note from [facility] however, not legible. I called them at [phone]. Spoke with Maralyn Sago. States office notes states: "Mild distance, both eyes gradual. Educate and monitor yearly. Call with changes". I asked if he was checked for macular edema. They were unclear. Nothing listed in chart. They are going to check with MD and call back with clarification. I provided [facility] phone number.

## 2018-05-29 NOTE — Telephone Encounter (Addendum)
Dr. Epimenio Foot reviewed report from eye doctor. It did include he was neg. For macular edema. Cleared to start Mayzent. I called Primary school teacher at [phone] ext. [phone] and left message for her to call back. I wanted to let her know pt is cleared for treatment

## 2018-05-29 NOTE — Telephone Encounter (Signed)
I called and spoke with father again. He states they faxed note to [phone]. Advised we have not received anything yet. He will call back and have them re-fax. Advised if I do not see anything come through by today I will call back.

## 2018-05-30 NOTE — Telephone Encounter (Signed)
I called and spoke with Dillard Cannon. At [phone] with Mayzent. Advised pt cleared for treatment. I gave name/DOB. She verbalized understanding and will update his record and get pt started. Nothing further needed

## 2018-06-06 NOTE — Telephone Encounter (Signed)
Called, LVM returning Mother's call.

## 2018-06-06 NOTE — Telephone Encounter (Signed)
Pt's mother Sherrie/DPR advised the pt is to start Siponimod Fumarate (MAYZENT) 2 MG TABS next. He is nervous about it and is wanting to be monitored for the dose. Please call to advise

## 2018-06-06 NOTE — Telephone Encounter (Signed)
Mother called office back. I reassured that pt was cleared for treatment via labs, EKG and eye exam. Dr. Epimenio Foot cleared him. Advised he can call if he has any problems at all. I am happy to help in the future if needed. She verbalized understanding and appreciation for call.

## 2018-07-10 ENCOUNTER — Telehealth: Payer: Self-pay | Admitting: *Deleted

## 2018-07-10 NOTE — Telephone Encounter (Signed)
Took call from phone staff. Spoke with Mertie Moores reimbursement specialist. Her phone: [phone]. She was told by adherence nurse that mother reported pt refuses to take medication. He never started on therapy. Advised I will let Dr. Epimenio Foot know.

## 2018-07-10 NOTE — Telephone Encounter (Signed)
Is the issue that he does not want Mayzent or does he not want any DMT for his MS?  We can set up a f/u to discuss furthetr

## 2018-07-11 NOTE — Telephone Encounter (Signed)
Called, LVM for mother to call back. Gave [facility] phone number.

## 2018-07-16 NOTE — Telephone Encounter (Signed)
Called, LVM for mother again to call office.

## 2018-07-16 NOTE — Telephone Encounter (Signed)
Called and spoke with husband. States son with mother and asked me to call mother to discuss Mayzent further.  Advised I have left two VM's for mother to call. He will reach out to her to call me when she is available.

## 2018-07-18 NOTE — Telephone Encounter (Signed)
Dr. Epimenio Foot- I have tried reaching out several times and cannot get in touch with the pt or mother at this point. What would you like to do?

## 2018-07-18 NOTE — Telephone Encounter (Signed)
Please send a letter to follow up to discuss further

## 2018-07-22 ENCOUNTER — Encounter: Payer: Self-pay | Admitting: *Deleted

## 2018-07-22 NOTE — Telephone Encounter (Signed)
Placed letter in mail for pt. 

## 2018-10-29 ENCOUNTER — Telehealth: Payer: Self-pay | Admitting: Neurology

## 2018-10-29 NOTE — Telephone Encounter (Signed)
I called Juan Mahoney back. They are closing out pt nonadherence case d/t them not being able to reach pt. I advised we made several attempts to call and mailed letter, but have not heard back from pt. Nothing further needed.

## 2018-10-29 NOTE — Telephone Encounter (Signed)
Juan Mahoney from with Mayzent is calling in requesting a call back [phone]

## 2019-02-07 ENCOUNTER — Other Ambulatory Visit: Payer: Self-pay

## 2019-02-07 DIAGNOSIS — Z20822 Contact with and (suspected) exposure to covid-19: Secondary | ICD-10-CM

## 2019-02-08 LAB — NOVEL CORONAVIRUS, NAA: SARS-CoV-2, NAA: NOT DETECTED

## 2019-03-17 ENCOUNTER — Other Ambulatory Visit: Payer: Self-pay

## 2019-03-17 DIAGNOSIS — Z20822 Contact with and (suspected) exposure to covid-19: Secondary | ICD-10-CM

## 2019-03-19 LAB — NOVEL CORONAVIRUS, NAA: SARS-CoV-2, NAA: NOT DETECTED

## 2019-05-13 ENCOUNTER — Ambulatory Visit: Payer: Self-pay | Attending: Internal Medicine

## 2019-05-13 ENCOUNTER — Other Ambulatory Visit: Payer: Managed Care, Other (non HMO)

## 2019-05-13 DIAGNOSIS — U071 COVID-19: Secondary | ICD-10-CM | POA: Insufficient documentation

## 2019-05-13 DIAGNOSIS — Z20822 Contact with and (suspected) exposure to covid-19: Secondary | ICD-10-CM

## 2019-05-14 ENCOUNTER — Telehealth: Payer: Self-pay | Admitting: Physician Assistant

## 2019-05-14 LAB — NOVEL CORONAVIRUS, NAA: SARS-CoV-2, NAA: DETECTED — AB

## 2019-05-14 NOTE — Telephone Encounter (Signed)
Called to discuss with Juan Mahoney about Covid symptoms and the use of bamlanivimab or casirivimab/imdevimab, a monoclonal antibody infusion for those with mild to moderate Covid symptoms and at a high risk of hospitalization.  He qualifies due to his multiple sclerosis and treatment for this.  Pt does not qualify for infusion therapy as pt has asymptomatic infection. Isolation precautions discussed. Advised to contact back for consideration should they develop symptoms. Patient verbalized understanding. I have sent him a mychart message with more information and a number to call if he does become symptomatic.      Patient Active Problem List   Diagnosis Date Noted  . Transverse myelitis (HCC) 04/16/2018  . ADD (attention deficit disorder) 04/16/2018  . High risk medication use 04/16/2018  . Lactic acidosis   . Nausea & vomiting 03/30/2018  . AKI (acute kidney injury) (HCC) 03/30/2018  . Gastroenteritis 03/30/2018  . Dehydration 03/30/2018  . MS (multiple sclerosis) (HCC) 09/18/2016    Cline Crock PA-C

## 2019-05-15 ENCOUNTER — Telehealth: Payer: Self-pay | Admitting: Nurse Practitioner

## 2019-05-15 NOTE — Telephone Encounter (Signed)
Returned call to patient after receiving a message from the Infusion hotline requesting to speak with a provider.   Unable to reach patient. Voicemail left and MyChart message also sent.   Per chart review patient was screened yesterday and was asymptomatic disqualifying him from receiving infusion.

## 2019-06-10 ENCOUNTER — Telehealth: Payer: Self-pay | Admitting: Neurology

## 2019-06-10 NOTE — Telephone Encounter (Signed)
Called pt. Scheduled work in appt for 06/16/19 at 2:30pm, check in 2:00pm. He passed covid screening. Advised him to make sure to wear mask to appt. He verbalized understanding.

## 2019-06-10 NOTE — Telephone Encounter (Signed)
Pt called stating he received a email from [facility] that an a fu apt needs to be scheduled in order for his paperwork to be filled out but no soon apts available at the time of call with NP or MD.

## 2019-06-10 NOTE — Telephone Encounter (Signed)
Dr. Epimenio Foot- pt noncompliant in the past with getting on therapy. Has not f/u. Now requesting to be worked in. Ok or scheduled for sooner available (May) and place on wait list if any cx?

## 2019-06-10 NOTE — Telephone Encounter (Signed)
That would be fine 

## 2019-06-16 ENCOUNTER — Encounter: Payer: Self-pay | Admitting: Neurology

## 2019-06-16 ENCOUNTER — Ambulatory Visit (INDEPENDENT_AMBULATORY_CARE_PROVIDER_SITE_OTHER): Payer: 59 | Admitting: Neurology

## 2019-06-16 ENCOUNTER — Other Ambulatory Visit: Payer: Self-pay

## 2019-06-16 VITALS — BP 114/59 | HR 63 | Temp 97.4°F | Ht 69.0 in | Wt 164.5 lb

## 2019-06-16 DIAGNOSIS — Z79899 Other long term (current) drug therapy: Secondary | ICD-10-CM | POA: Diagnosis not present

## 2019-06-16 DIAGNOSIS — G35 Multiple sclerosis: Secondary | ICD-10-CM | POA: Diagnosis not present

## 2019-06-16 DIAGNOSIS — F988 Other specified behavioral and emotional disorders with onset usually occurring in childhood and adolescence: Secondary | ICD-10-CM | POA: Diagnosis not present

## 2019-06-16 MED ORDER — AMPHETAMINE-DEXTROAMPHET ER 20 MG PO CP24: 20.0000 mg | ORAL_CAPSULE | Freq: Every day | ORAL | 0 refills | Status: DC

## 2019-06-16 NOTE — Progress Notes (Signed)
Gave completed/signed functional capacities work evaluation form back to medical records to process for pt.

## 2019-06-16 NOTE — Progress Notes (Signed)
[facility]  PATIENT: Juan Mahoney DOB: [birth year]  REFERRING DOCTOR OR PCP:   Meredith Staggers, MD; Dr. Gaynelle Adu, [facility] SOURCE: Patient, notes from Dr. Gaynelle Adu and Dr. Neva Seat, imaging and laboratory reports  _________________________________   HISTORICAL  CHIEF COMPLAINT:  Chief Complaint  Patient presents with  . Multiple Sclerosis    Room 12. Last seen 04/24/18. Feels he is doing well on Mayzent. No verbalized concerns today.    HISTORY OF PRESENT ILLNESS:  Juan Mahoney with relapsing remitting multiple sclerosis diagnosed in 2018.     Update 06/16/2019: At the last visit, he was started on Mayzent.   He tolerates it well and has not had any exacerbation or new symptoms.    Gait, strength and sensation are doing well.  He denies any difficulties with bladder or vision.  He has had some fatigue which seems worse.  He feels he is back to his baseline now.  When he was more fatigue he took some days off work.  He has ADD.  Adderall helps with attention/focus and also helps his fatigue.  He works as a Barista.   He does not do any climbing at work.  He has not had difficulty with his job for the most part however, he needed to take some days off when he was more fatigued.   MS history: He was diagnosed with MS after presenmting with right arm numbness in 2018.    He went to the ED and an MRI showed a spinal cord lesion at C1C2.  Follow up brain MRI confirmed multiple lesions in the brain and Thoracic spine.   He was referred to Dr. Gaynelle Adu at [facility] and diagnosed with MS. I saw him for the first time January 2020.  He was started on Mayzent.  MRI of the brain 04/07/2016 showed multiple white matter lesions within the periventricular white matter of the hemispheres and also a lesion in the right internal capsule, right thalamus and left lateral medulla.  There is no definite enhancement noted.   MRI  of the cervical spine showed a nonenhancing T2 hyperintense focus at C1-C2.  MRI of the thoracic spine showed a T2 hyperintense lesion on the left adjacent to T4.  It did not enhance.  He has a great uncle (paternal side) with MS who recently passed away. He had severe impairments but was diagnosed in the 1980's.     REVIEW OF SYSTEMS: Constitutional: No fevers, chills, sweats, or change in appetite Eyes: No visual changes, double vision, eye pain Ear, nose and throat: No hearing loss, ear pain, nasal congestion, sore throat Cardiovascular: No chest pain, palpitations Respiratory: No shortness of breath at rest or with exertion.   No wheezes GastrointestinaI: No nausea, vomiting, diarrhea, abdominal pain, fecal incontinence Genitourinary: No dysuria, urinary retention or frequency.  No nocturia. Musculoskeletal: No neck pain, back pain Integumentary: No rash, pruritus, skin lesions Neurological: as above Psychiatric: No depression at this time.  No anxiety Endocrine: No palpitations, diaphoresis, change in appetite, change in weigh or increased thirst Hematologic/Lymphatic: No anemia, purpura, petechiae. Allergic/Immunologic: No itchy/runny eyes, nasal congestion, recent allergic reactions, rashes  ALLERGIES: Allergies  Allergen Reactions  . Amoxicillin     Other reaction(s): rash/itching  . Penicillin G Rash    DID THE REACTION INVOLVE: Swelling of the face/tongue/throat, SOB, or low BP? No Sudden or severe rash/hives, skin peeling, or the inside of the mouth or nose? Yes Did it  require medical treatment? No When did it last happen?2015 If all above answers are "NO", may proceed with cephalosporin use.     HOME MEDICATIONS:  Current Outpatient Medications:  .  amphetamine-dextroamphetamine (ADDERALL XR) 20 MG 24 hr capsule, Take 1 capsule (20 mg total) by mouth daily., Disp: 30 capsule, Rfl: 0 .  Siponimod Fumarate (MAYZENT) 2 MG TABS, Take by mouth., Disp: , Rfl:    PAST MEDICAL HISTORY: Past Medical History:  Diagnosis Date  . Multiple sclerosis ([place])    Dx 2018    PAST SURGICAL HISTORY: Past Surgical History:  Procedure Laterality Date  . ARTHROSCOPIC REPAIR ACL Right     FAMILY HISTORY: Family History  Problem Relation Age of Onset  . Hypertension Mother   . Cancer Maternal Grandmother     SOCIAL HISTORY:  Social History   Socioeconomic History  . Marital status: Single    Spouse name: Not on file  . Number of children: Not on file  . Years of education: Not on file  . Highest education level: Not on file  Occupational History  . Not on file  Tobacco Use  . Smoking status: Never Smoker  . Smokeless tobacco: Never Used  Substance and Sexual Activity  . Alcohol use: No  . Drug use: Yes    Types: Marijuana  . Sexual activity: Not Currently  Other Topics Concern  . Not on file  Social History Narrative   Lives   Caffeine use: Soda sometimes    Right handed    Social Determinants of Health   Financial Resource Strain:   . Difficulty of Paying Living Expenses:   Food Insecurity:   . Worried About Charity fundraiser in the Last Year:   . Arboriculturist in the Last Year:   Transportation Needs:   . Film/video editor (Medical):   Marland Kitchen Lack of Transportation (Non-Medical):   Physical Activity:   . Days of Exercise per Week:   . Minutes of Exercise per Session:   Stress:   . Feeling of Stress :   Social Connections:   . Frequency of Communication with Friends and Family:   . Frequency of Social Gatherings with Friends and Family:   . Attends Religious Services:   . Active Member of Clubs or Organizations:   . Attends Archivist Meetings:   Marland Kitchen Marital Status:   Intimate Partner Violence:   . Fear of Current or Ex-Partner:   . Emotionally Abused:   Marland Kitchen Physically Abused:   . Sexually Abused:      PHYSICAL EXAM  Vitals:   06/16/19 1410  BP: (!) 114/59  Pulse: 63  Temp: (!) 97.4 F (36.3 C)   Weight: 164 lb 8 oz (74.6 kg)  Height: 5\' 9"  (1.753 m)    Body mass index is 24.29 kg/m.   General: The patient is well-developed and well-nourished and in no acute distress   Skin: Extremities are without rash or edema.   Neurologic Exam  Mental status: The patient is alert and oriented x 3 at the time of the examination. The patient has apparent normal recent and remote memory, with an apparently normal attention span and concentration ability.   Speech is normal.  Cranial nerves: Extraocular movements are full. Pupils are equal, round, and reactive to light and accomodation.  Visual fields are full.  Facial symmetry is present. There is good facial sensation to soft touch bilaterally.Facial strength is normal.  Trapezius and sternocleidomastoid strength is  normal. No dysarthria is noted.    No obvious hearing deficits are noted.  Motor:  Muscle bulk is normal.   Tone is normal. Strength is  5 / 5 in all 4 extremities.   Sensory: Sensory testing is intact to pinprick, soft touch and vibration sensation in all 4 extremities.  Coordination: Cerebellar testing reveals good finger-nose-finger and heel-to-shin bilaterally.  Gait and station: Station is normal.   Gait is normal. Tandem gait is slightly wide. Romberg is negative.   Reflexes: Deep tendon reflexes are symmetric but increased with crossed adductor responses at the knees.   No ankle clonus.   Plantar responses are flexor.    DIAGNOSTIC DATA (LABS, IMAGING, TESTING) - I reviewed patient records, labs, notes, testing and imaging myself where available.  Lab Results  Component Value Date   WBC 7.6 03/31/2018   HGB 14.2 03/31/2018   HCT 45.0 03/31/2018   MCV 85.1 03/31/2018   PLT 196 03/31/2018      Component Value Date/Time   NA 139 03/31/2018 0802   K 3.8 03/31/2018 0802   CL 107 03/31/2018 0802   CO2 25 03/31/2018 0802   GLUCOSE 95 03/31/2018 0802   BUN 7 03/31/2018 0802   CREATININE 1.09 03/31/2018 0802    CALCIUM 8.5 (L) 03/31/2018 0802   PROT 8.1 03/30/2018 0000   ALBUMIN 4.9 03/30/2018 0000   AST 36 03/30/2018 0000   ALT 27 03/30/2018 0000   ALKPHOS 57 03/30/2018 0000   BILITOT 1.7 (H) 03/30/2018 0000   GFRNONAA >60 03/31/2018 0802   GFRAA >60 03/31/2018 0802       ASSESSMENT AND PLAN  Multiple sclerosis (HCC) - Plan: CBC with Differential/Platelet, Hepatic function panel, MR BRAIN WO CONTRAST  Attention deficit disorder, unspecified hyperactivity presence  High risk medication use - Plan: CBC with Differential/Platelet, Hepatic function panel  1.    Continue Mayzent.  We will check some blood work today.  We also need to check an MRI of the brain to determine if he is having any subclinical progression.  If this is occurring we need to consider a different disease modifying therapy. 2.   Continue Adderall XR for attention deficit disorder and fatigue. 3.   I filled out paperwork to have him return to work with full duties. 4.   Return in 6 months or sooner for new or worsening neurologic symptoms.  Tanessa Tidd A. Epimenio Foot, MD, [facility] 06/16/2019, 3:52 PM Certified in Neurology, Clinical Neurophysiology, Sleep Medicine, Pain Medicine and Neuroimaging  [facility] [address] [place], [place] [address] [phone]

## 2019-06-17 ENCOUNTER — Telehealth: Payer: Self-pay | Admitting: *Deleted

## 2019-06-17 LAB — CBC WITH DIFFERENTIAL/PLATELET
Basophils Absolute: 0.1 10*3/uL (ref 0.0–0.2)
Basos: 1 %
EOS (ABSOLUTE): 0.2 10*3/uL (ref 0.0–0.4)
Eos: 3 %
Hematocrit: 42.4 % (ref 37.5–51.0)
Hemoglobin: 14.8 g/dL (ref 13.0–17.7)
Immature Grans (Abs): 0.1 10*3/uL (ref 0.0–0.1)
Immature Granulocytes: 1 %
Lymphocytes Absolute: 2.3 10*3/uL (ref 0.7–3.1)
Lymphs: 28 %
MCH: 29.9 pg (ref 26.6–33.0)
MCHC: 34.9 g/dL (ref 31.5–35.7)
MCV: 86 fL (ref 79–97)
Monocytes Absolute: 0.6 10*3/uL (ref 0.1–0.9)
Monocytes: 8 %
Neutrophils Absolute: 4.9 10*3/uL (ref 1.4–7.0)
Neutrophils: 59 %
Platelets: 205 10*3/uL (ref 150–450)
RBC: 4.95 x10E6/uL (ref 4.14–5.80)
RDW: 13.1 % (ref 11.6–15.4)
WBC: 8.2 10*3/uL (ref 3.4–10.8)

## 2019-06-17 LAB — HEPATIC FUNCTION PANEL
ALT: 7 IU/L (ref 0–44)
AST: 15 IU/L (ref 0–40)
Albumin: 4.3 g/dL (ref 4.1–5.2)
Alkaline Phosphatase: 68 IU/L (ref 39–117)
Bilirubin Total: 0.4 mg/dL (ref 0.0–1.2)
Bilirubin, Direct: 0.13 mg/dL (ref 0.00–0.40)
Total Protein: 6.3 g/dL (ref 6.0–8.5)

## 2019-06-17 NOTE — Telephone Encounter (Signed)
Pt form faxed on 06/16/19 to [employer] @ [phone]

## 2019-06-23 ENCOUNTER — Telehealth: Payer: Self-pay | Admitting: Neurology

## 2019-06-23 NOTE — Telephone Encounter (Signed)
[employer] Auth: [id number] (exp. 06/23/19 to 08/07/19) order sent to GI. They will reach out to the patient to schedule.

## 2019-07-02 DIAGNOSIS — Z0289 Encounter for other administrative examinations: Secondary | ICD-10-CM

## 2019-12-17 ENCOUNTER — Encounter: Payer: Self-pay | Admitting: Family Medicine

## 2019-12-17 ENCOUNTER — Ambulatory Visit: Payer: 59 | Admitting: Family Medicine

## 2020-05-25 IMAGING — CT CT ABD-PELV W/ CM
2 of 4 series · 15 of 46 positions shown, 17 images · IV contrast (ISOVUE)
Comparison: None.

CLINICAL DATA: Acute onset of generalized abdominal pain and
nausea. Vomiting. Personal history of multiple sclerosis.

EXAM:
CT ABDOMEN AND PELVIS WITH CONTRAST
TECHNIQUE: Multidetector CT imaging of the abdomen and pelvis was performed
using the standard protocol following bolus administration of
intravenous contrast.
CONTRAST:  100mL [id number] IOPAMIDOL ([id number]) INJECTION 61%

[Series 2: axial st · axial · 0.82mm/px · z∈[+1233,+1638]mm · 12 of 91 slices shown, 14 images]
[im 5/91  soft-tissue]
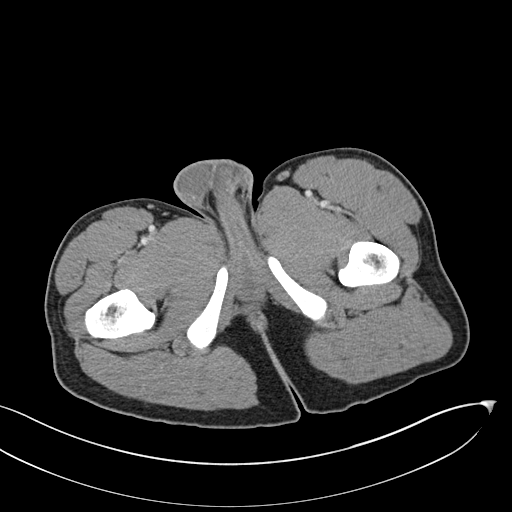
[im 5/91  bone]
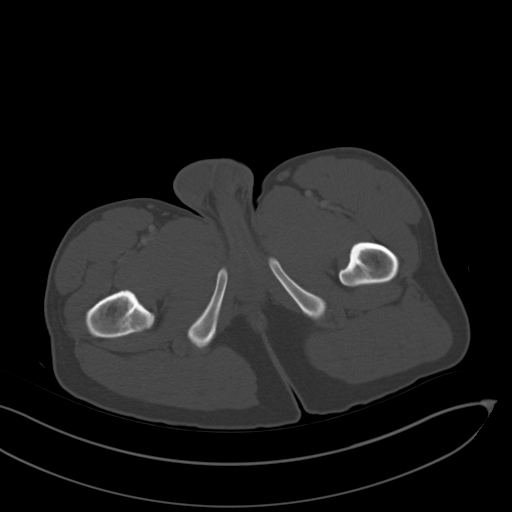
[im 14/91  soft-tissue]
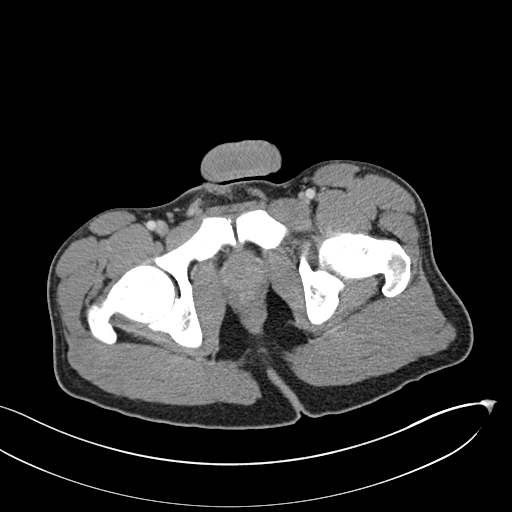
[im 19/91  soft-tissue]
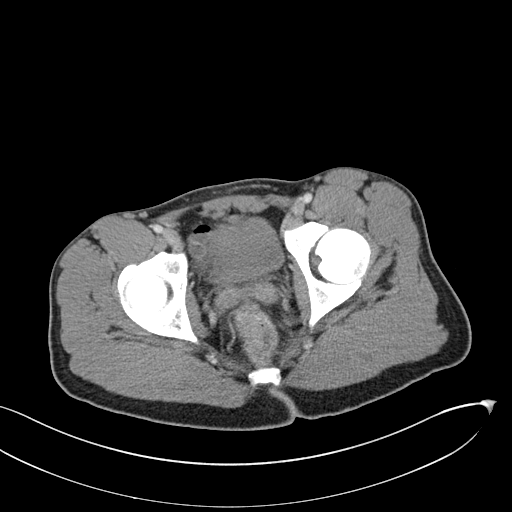
[im 28/91  soft-tissue]
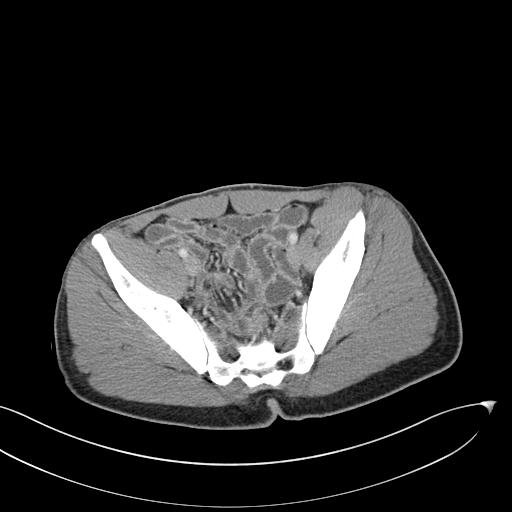
[im 37/91  soft-tissue]
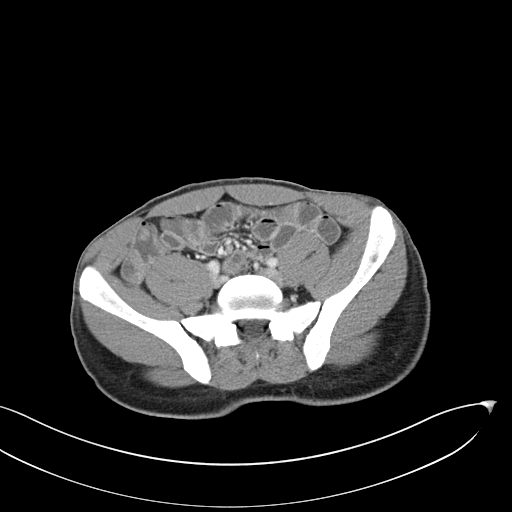
[im 41/91  soft-tissue]
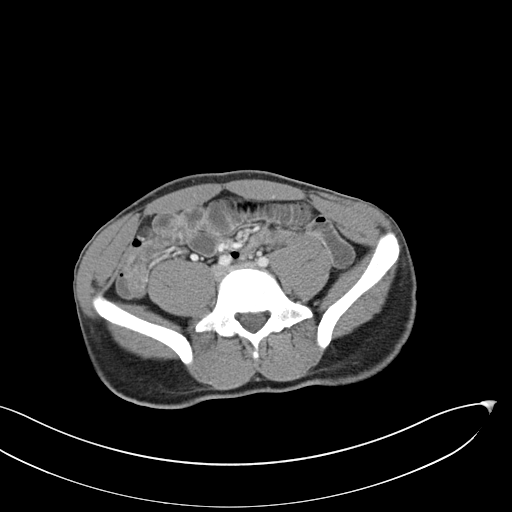
[im 50/91  soft-tissue]
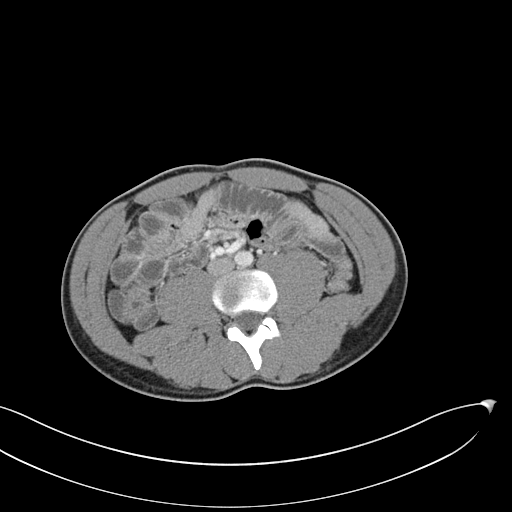
[im 55/91  soft-tissue]
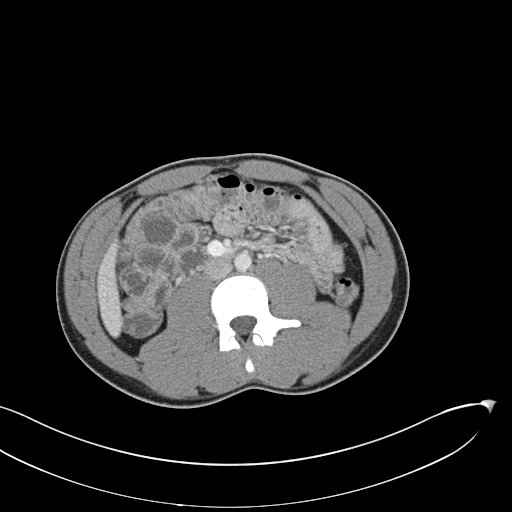
[im 64/91  soft-tissue]
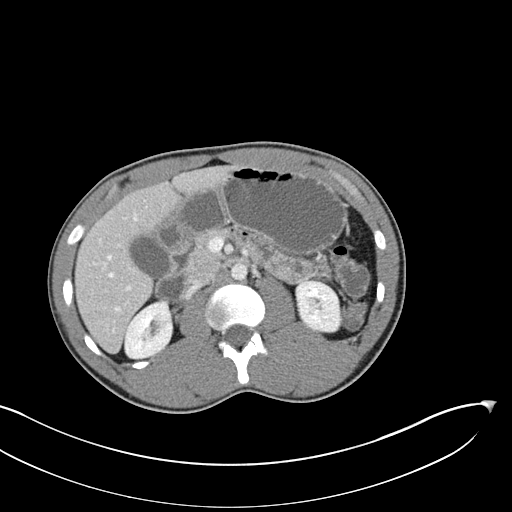
[im 64/91  bone]
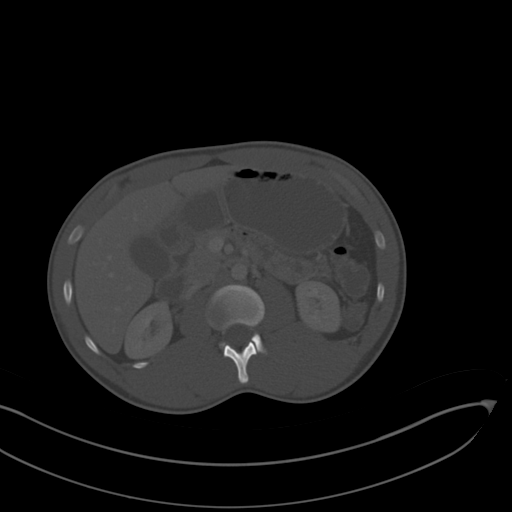
[im 73/91  soft-tissue]
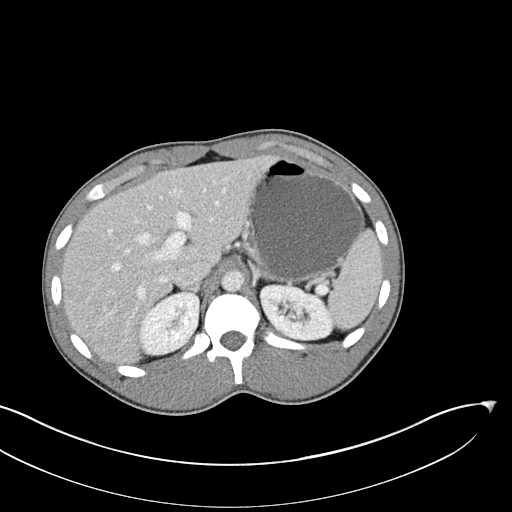
[im 77/91  soft-tissue]
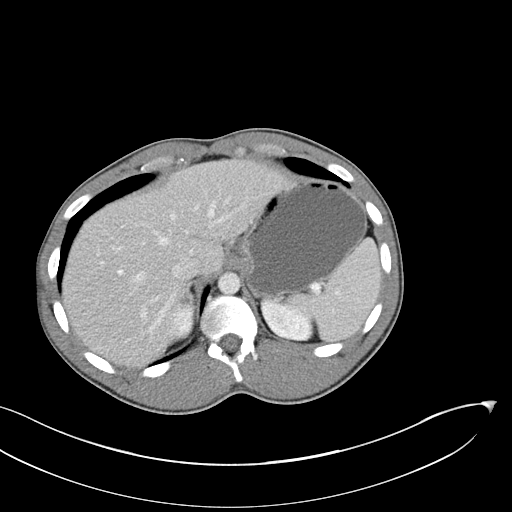
[im 86/91  soft-tissue]
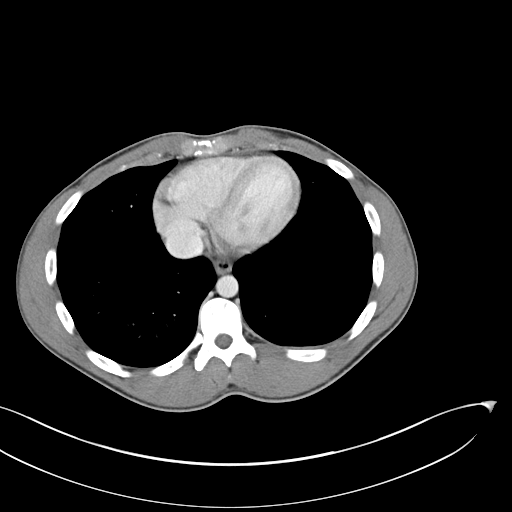

[Series 4: coronal st · coronal · 0.64mm/px · 3 of 100 slices shown]
[im 34/100  soft-tissue]
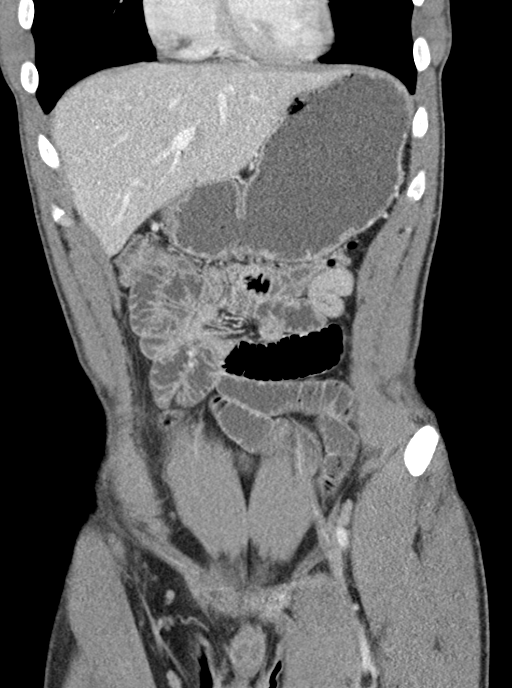
[im 45/100  soft-tissue]
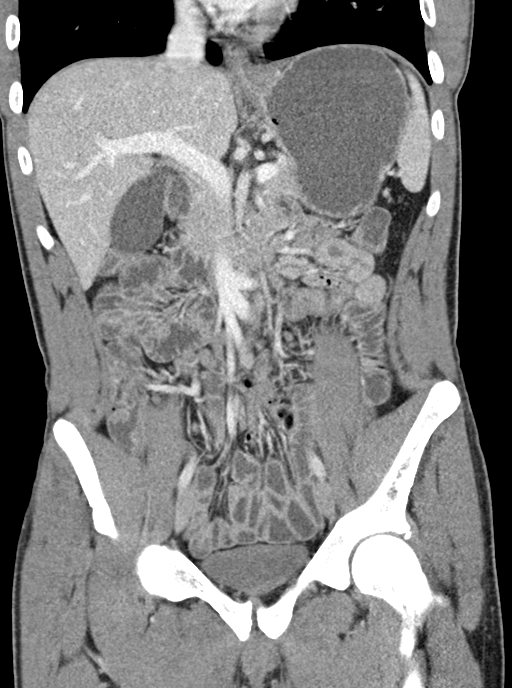
[im 56/100  soft-tissue]
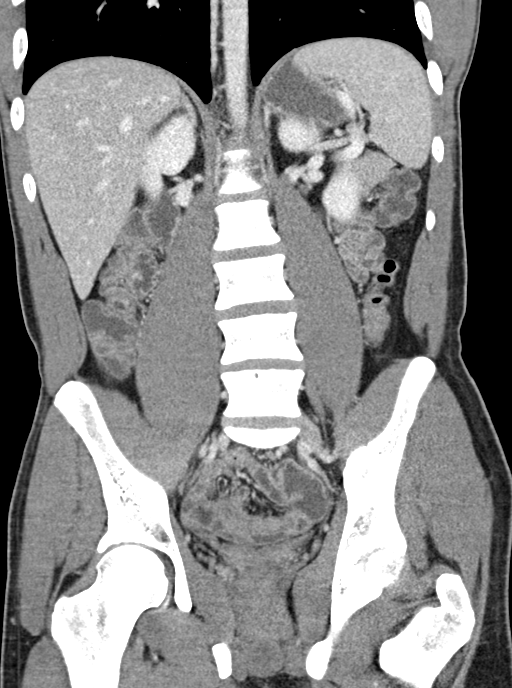

[15 of 46 positions shown; findings below may reference images not displayed]

FINDINGS: Lower chest: The visualized lung bases are grossly clear. The
visualized portions of the mediastinum are unremarkable.

Hepatobiliary: The liver is unremarkable in appearance. The
gallbladder is unremarkable in appearance. The common bile duct
remains normal in caliber.

Pancreas: An apparent 6 mm cystic focus at the pancreatic tail is
likely benign, given the patient's age.

Spleen: The spleen is unremarkable in appearance.

Adrenals/Urinary Tract: The adrenal glands are unremarkable in
appearance. The kidneys are within normal limits. There is no
evidence of hydronephrosis. No renal or ureteral stones are
identified. No perinephric stranding is seen.

Stomach/Bowel: The stomach is unremarkable in appearance. The small
bowel is within normal limits. The appendix is normal in caliber,
without evidence of appendicitis. The colon is unremarkable in
appearance.

Vascular/Lymphatic: The abdominal aorta is unremarkable in
appearance. The inferior vena cava is grossly unremarkable. No
retroperitoneal lymphadenopathy is seen. No pelvic sidewall
lymphadenopathy is identified.

Reproductive: The bladder is decompressed and not well
characterized. The prostate is normal in size.

Other: No additional soft tissue abnormalities are seen.

Musculoskeletal: No acute osseous abnormalities are identified. The
visualized musculature is unremarkable in appearance.
IMPRESSION: 1. No acute abnormality seen within the abdomen or pelvis.
2. Apparent 6 mm cystic focus at the pancreatic tail is likely
benign, given the patient's age. However, follow-up MRCP would be
helpful in 12 months to ensure stability, as deemed clinically
appropriate.

## 2020-12-02 ENCOUNTER — Emergency Department (HOSPITAL_BASED_OUTPATIENT_CLINIC_OR_DEPARTMENT_OTHER): Payer: Self-pay

## 2020-12-02 ENCOUNTER — Emergency Department (HOSPITAL_BASED_OUTPATIENT_CLINIC_OR_DEPARTMENT_OTHER)
Admission: EM | Admit: 2020-12-02 | Discharge: 2020-12-02 | Disposition: A | Discharge status: Home / Self Care | Payer: Self-pay | Attending: Emergency Medicine | Admitting: Emergency Medicine

## 2020-12-02 ENCOUNTER — Other Ambulatory Visit: Payer: Self-pay

## 2020-12-02 ENCOUNTER — Encounter (HOSPITAL_BASED_OUTPATIENT_CLINIC_OR_DEPARTMENT_OTHER): Payer: Self-pay | Admitting: *Deleted

## 2020-12-02 DIAGNOSIS — Y9367 Activity, basketball: Secondary | ICD-10-CM | POA: Insufficient documentation

## 2020-12-02 DIAGNOSIS — X501XXA Overexertion from prolonged static or awkward postures, initial encounter: Secondary | ICD-10-CM | POA: Insufficient documentation

## 2020-12-02 DIAGNOSIS — M25531 Pain in right wrist: Secondary | ICD-10-CM | POA: Insufficient documentation

## 2020-12-02 DIAGNOSIS — S93401A Sprain of unspecified ligament of right ankle, initial encounter: Secondary | ICD-10-CM | POA: Insufficient documentation

## 2020-12-02 MED ORDER — IBUPROFEN 800 MG PO TABS: 800.0000 mg | ORAL_TABLET | Freq: Three times a day (TID) | ORAL | 0 refills | Status: DC | PRN

## 2020-12-02 MED ORDER — DICLOFENAC SODIUM 1 % EX GEL: 2.0000 g | Freq: Four times a day (QID) | CUTANEOUS | 0 refills | Status: DC | PRN

## 2020-12-02 NOTE — ED Triage Notes (Signed)
Right ankle injury. He jumped during basketball and fell onto his right ankle. Right wrist injury.

## 2020-12-02 NOTE — ED Provider Notes (Signed)
Emergency Department Provider Note   I have reviewed the triage vital signs and the nursing notes.   HISTORY  Chief Complaint Ankle Injury   HPI Juan Mahoney is a 30 y.o. male with past medical history reviewed below presents to the emergency department with right ankle pain and swelling along with some right wrist discomfort after injury yesterday.  Patient was playing basketball and went up for the ball and came down awkwardly on his right ankle.  He thinks he may have rolled it.  He fell to the ground onto his outstretched right hand.  He noted some discomfort in the right wrist which is mild and improving significantly but swelling continues in the right ankle.  He has pain and difficulty with ambulation.  No pain into the knee or hip.  No lower back pain.  Pain is moderate and worse with movement or touching.   Past Medical History:  Diagnosis Date   Multiple sclerosis (HCC)    Dx 2018    Patient Active Problem List   Diagnosis Date Noted   Transverse myelitis (HCC) 04/16/2018   ADD (attention deficit disorder) 04/16/2018   High risk medication use 04/16/2018   Lactic acidosis    Nausea & vomiting 03/30/2018   AKI (acute kidney injury) (HCC) 03/30/2018   Gastroenteritis 03/30/2018   Dehydration 03/30/2018   Multiple sclerosis (HCC) 09/18/2016    Past Surgical History:  Procedure Laterality Date   ARTHROSCOPIC REPAIR ACL Right     Allergies Amoxicillin and Penicillin g  Family History  Problem Relation Age of Onset   Hypertension Mother    Cancer Maternal Grandmother     Social History Social History   Tobacco Use   Smoking status: Never   Smokeless tobacco: Never  Vaping Use   Vaping Use: Never used  Substance Use Topics   Alcohol use: No   Drug use: Not Currently    Types: Marijuana    Review of Systems  Constitutional: No fever/chills Eyes: No visual changes. ENT: No sore throat. Cardiovascular: Denies chest  pain. Respiratory: Denies shortness of breath. Gastrointestinal: No abdominal pain.  Musculoskeletal: Negative for back pain. Positive right ankle and wrist pain.  Skin: Negative for rash. Neurological: Negative for focal weakness or numbness.  10-point ROS otherwise negative.  ____________________________________________   PHYSICAL EXAM:  VITAL SIGNS: ED Triage Vitals  Enc Vitals Group     BP 12/02/20 1513 129/73     Pulse Rate 12/02/20 1513 76     Resp 12/02/20 1513 18     Temp 12/02/20 1513 98.4 F (36.9 C)     Temp Source 12/02/20 1513 Oral     SpO2 12/02/20 1513 95 %     Weight 12/02/20 1511 150 lb (68 kg)     Height 12/02/20 1511 5\' 9"  (1.753 m)   Constitutional: Alert and oriented. Well appearing and in no acute distress. Eyes: Conjunctivae are normal.  Head: Atraumatic. Nose: No congestion/rhinnorhea. Mouth/Throat: Mucous membranes are moist.   Neck: No stridor.   Cardiovascular: Good peripheral circulation.   Respiratory: Normal respiratory effort.   Gastrointestinal:  No distention.  Musculoskeletal: Generalized right ankle swelling worse laterally.  No erythema or warmth to touch.  No tenderness to the foot or proximal fibula.  The wrist has normal range of motion without swelling.  No scaphoid tenderness.  Neurologic:  Normal speech and language. No gross focal neurologic deficits are appreciated.  Skin:  Skin is warm, dry and intact. No rash noted.  ____________________________________________  RADIOLOGY  DG Wrist Complete Right  Result Date: 12/02/2020 CLINICAL DATA:  Wrist injury playing basketball EXAM: RIGHT WRIST - COMPLETE 3+ VIEW COMPARISON:  None. FINDINGS: There is no acute fracture or dislocation. There is mild positive ulnar variance. Alignment is otherwise normal. The joint spaces are preserved. The soft tissues are unremarkable. IMPRESSION: Unremarkable wrist radiographs. Electronically Signed   By: Lesia Hausen M.D.   On: 12/02/2020 15:55   DG  Ankle Complete Right  Result Date: 12/02/2020 CLINICAL DATA:  Right ankle injury EXAM: RIGHT ANKLE - COMPLETE 3+ VIEW COMPARISON:  None. FINDINGS: There is no acute fracture or dislocation. Ankle alignment is normal. The ankle mortise asymmetrically intact. There is marked soft tissue swelling over the lateral malleolus. IMPRESSION: Marked lateral malleolar soft tissue swelling with no fracture identified. Electronically Signed   By: Lesia Hausen M.D.   On: 12/02/2020 15:54    ____________________________________________   PROCEDURES  Procedure(s) performed:   Procedures  None  ____________________________________________   INITIAL IMPRESSION / ASSESSMENT AND PLAN / ED COURSE  Pertinent labs & imaging results that were available during my care of the patient were reviewed by me and considered in my medical decision making (see chart for details).   Patient presents emergency department with right ankle pain primarily.  Plain films show swelling but no bony abnormality.  Plan to treat as a sprain with RICE and air splint and crutches.  Please PCP and sports med contact information for follow-up.  Patient's wrist x-ray reviewed showing no acute fractures.  He has no scaphoid tenderness to suspect occult injury in that location requiring splint. 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    ____________________________________________  FINAL CLINICAL IMPRESSION(S) / ED DIAGNOSES  Final diagnoses:  Sprain of right ankle, unspecified ligament, initial encounter  Right wrist pain     NEW OUTPATIENT MEDICATIONS STARTED DURING THIS VISIT:  New Prescriptions   DICLOFENAC SODIUM (VOLTAREN) 1 % GEL    Apply 2 g topically 4 (four) times daily as needed.   IBUPROFEN (ADVIL) 800 MG TABLET    Take 1 tablet (800 mg total) by mouth every 8 (eight) hours as needed.    Note:  This document was prepared using Dragon voice recognition software and may include unintentional dictation  errors.  Alona Bene, MD, [facility]    Clementine Soulliere, Arlyss Repress, MD 12/02/20 [phone]

## 2020-12-02 NOTE — Discharge Instructions (Addendum)
As we discussed, you do not have any broken or dislocated bones in your foot or ankle, but you do have an ankle sprain. There is always a chance that a small fracture did not appear on today's x-ray. Please read through the included information about routine injury care (RICE = rest, ice, compression, elevation), and take over-the-counter pain medicine according to label instructions.  If you do not have any reason to avoid ibuprofen, you can also consider taking ibuprofen 800 mg 3 times a day with meals, but do this for no more than 5 days as it may cause to some stomach discomfort over time.  Use crutches if provided and you may bear weight as tolerated.  Follow-up is recommended with the orthopedic surgeon or with your regular doctor. ° ° ° ° ° °

## 2021-09-19 ENCOUNTER — Telehealth: Payer: Self-pay

## 2021-09-19 ENCOUNTER — Encounter: Payer: Self-pay | Admitting: Neurology

## 2021-09-19 ENCOUNTER — Ambulatory Visit (INDEPENDENT_AMBULATORY_CARE_PROVIDER_SITE_OTHER): Payer: Self-pay | Admitting: Neurology

## 2021-09-19 VITALS — BP 119/81 | HR 83 | Ht 69.0 in | Wt 149.0 lb

## 2021-09-19 DIAGNOSIS — F418 Other specified anxiety disorders: Secondary | ICD-10-CM

## 2021-09-19 DIAGNOSIS — G35 Multiple sclerosis: Secondary | ICD-10-CM

## 2021-09-19 DIAGNOSIS — R269 Unspecified abnormalities of gait and mobility: Secondary | ICD-10-CM

## 2021-09-19 MED ORDER — OZANIMOD HCL 0.92 MG PO CAPS: ORAL_CAPSULE | ORAL | 11 refills | Status: AC

## 2021-09-19 MED ORDER — SERTRALINE HCL 50 MG PO TABS: 50.0000 mg | ORAL_TABLET | Freq: Every day | ORAL | 11 refills | Status: AC

## 2021-09-19 NOTE — Telephone Encounter (Signed)
Received zeposia start form from Dr. Epimenio Foot. Per Dr. Epimenio Foot, patient may start zeposia and is cleared for therapy.  I called patient. He confirms that he is unemployed. I advised him that [facility] will be calling him to discuss this further. Pt verbalized understanding.

## 2021-09-19 NOTE — Progress Notes (Signed)
[facility]  PATIENT: Juan Mahoney DOB: November 16, 1990  REFERRING DOCTOR OR PCP:   Meredith Staggers, MD; Dr. Gaynelle Adu, [facility] SOURCE: Patient, notes from Dr. Gaynelle Adu and Dr. Neva Seat, imaging and laboratory reports  _________________________________   HISTORICAL  CHIEF COMPLAINT:  Chief Complaint  Patient presents with   Follow-up    Pt with dad and aunt, rm 1. Pt presents today from a recent ER. He had facial numbness and right side felt heavy and increase in fatigue and difficulty with walking. Feels like balance is off. 4 weeks ago was doing well he went out and played basketball for about 2 hrs. He has not been on any treatment for MS.     HISTORY OF PRESENT ILLNESS:  Juan Mahoney with relapsing remitting multiple sclerosis diagnosed in 2018.     Update 09/19/2021: I last saw him about 2 years ago.  At that time, we had started Mayzent and tolerated it well.  He had the onset of facial numbness and right arm heaviness starting 2 weeks ago.   Additionally, gait was reduced.   His father notes the right foot drags across the floor.       He denies much issues but family notes he had other exacerbations and went to ED a few times for teroids.      He is walking slowly and has had some trips but no falls.   His right leg feels heavy and drags.   He noes some right facial and hand numbness/tingling.    Bladder function is fine.   Vision is fine.  He has had more fatigue since new numbness started.    He was on  ADD and felt it helps with attention/focus and a little bit for fatigue.  He feels stress and family notes depression.  He has apathy and does less for himself.   He lives with his parents.    He is not on any medication.    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      MS history: He was diagnosed with MS after presenmting with right arm numbness in 2018.    He went  to the ED and an MRI showed a spinal cord lesion at C1C2.  Follow up brain MRI confirmed multiple lesions in the brain and Thoracic spine.   He was referred to Dr. Gaynelle Adu at [facility] and diagnosed with MS. I saw him for the first time January 2020.  He was started on Mayzent.  He took just a few.    He was lost to follow-up after 2021 and returned June 2023 after an exacerbation   IMAGING: MRI of the brain 04/07/2016 showed multiple white matter lesions within the periventricular white matter of the hemispheres and also a lesion in the right internal capsule, right thalamus and left lateral medulla.  There is no definite enhancement noted.   MRI of the cervical spine showed a nonenhancing T2 hyperintense focus at C1-C2.  MRI of the thoracic spine showed a T2 hyperintense lesion on the left adjacent to T4.  It did not enhance.  MRI of the cervical spine 04/27/2018 shows T2 hyperintense focus at the cervicomedullary junction laterally to the right at C6-C7 and posteriorly at C7.  There is a more subtle focus to the right at C4-C5 that is only seen on the axial images.    MRI of the  brain 04/27/2018 shows scattered T2/FLAIR hyperintense foci predominantly in the periventricular white matter, radially oriented to the ventricles.  A focus is also noted in the anterior medulla and at the cervicomedullary junction.  Is    He has a great uncle (paternal side) with MS who recently passed away. He had severe impairments but was diagnosed in the 1980's.     REVIEW OF SYSTEMS: Constitutional: No fevers, chills, sweats, or change in appetite Eyes: No visual changes, double vision, eye pain Ear, nose and throat: No hearing loss, ear pain, nasal congestion, sore throat Cardiovascular: No chest pain, palpitations Respiratory:  No shortness of breath at rest or with exertion.   No wheezes GastrointestinaI: No nausea, vomiting, diarrhea, abdominal pain, fecal incontinence Genitourinary:  No dysuria, urinary retention  or frequency.  No nocturia. Musculoskeletal:  No neck pain, back pain Integumentary: No rash, pruritus, skin lesions Neurological: as above Psychiatric: No depression at this time.  No anxiety Endocrine: No palpitations, diaphoresis, change in appetite, change in weigh or increased thirst Hematologic/Lymphatic:  No anemia, purpura, petechiae. Allergic/Immunologic: No itchy/runny eyes, nasal congestion, recent allergic reactions, rashes  ALLERGIES: Allergies  Allergen Reactions   Amoxicillin     Other reaction(s): rash/itching   Penicillin G Rash    DID THE REACTION INVOLVE: Swelling of the face/tongue/throat, SOB, or low BP? No Sudden or severe rash/hives, skin peeling, or the inside of the mouth or nose? Yes Did it require medical treatment? No When did it last happen?      2015 If all above answers are "NO", may proceed with cephalosporin use.     HOME MEDICATIONS:  Current Outpatient Medications:    ibuprofen (ADVIL) 800 MG tablet, Take 1 tablet (800 mg total) by mouth every 8 (eight) hours as needed., Disp: 21 tablet, Rfl: 0   Ozanimod HCl 0.92 MG CAPS, One po qd, Disp: 30 capsule, Rfl: 11   sertraline (ZOLOFT) 50 MG tablet, Take 1 tablet (50 mg total) by mouth daily., Disp: 30 tablet, Rfl: 11  PAST MEDICAL HISTORY: Past Medical History:  Diagnosis Date   Multiple sclerosis (HCC)    Dx 2018    PAST SURGICAL HISTORY: Past Surgical History:  Procedure Laterality Date   ARTHROSCOPIC REPAIR ACL Right     FAMILY HISTORY: Family History  Problem Relation Age of Onset   Hypertension Mother    Cancer Maternal Grandmother     SOCIAL HISTORY:  Social History   Socioeconomic History   Marital status: Single    Spouse name: Not on file   Number of children: Not on file   Years of education: Not on file   Highest education level: Not on file  Occupational History   Not on file  Tobacco Use   Smoking status: Never   Smokeless tobacco: Never  Vaping Use   Vaping  Use: Never used  Substance and Sexual Activity   Alcohol use: No   Drug use: Not Currently    Types: Marijuana   Sexual activity: Not Currently  Other Topics Concern   Not on file  Social History Narrative   Lives   Caffeine use: Soda sometimes    Right handed    Social Determinants of Health   Financial Resource Strain: Not on file  Food Insecurity: Not on file  Transportation Needs: Not on file  Physical Activity: Not on file  Stress: Not on file  Social Connections: Not on file  Intimate Partner Violence: Not on file     PHYSICAL  EXAM  Vitals:   09/19/21 0907  BP: 119/81  Pulse: 83  Weight: 149 lb (67.6 kg)  Height: 5\' 9"  (1.753 m)     Body mass index is 22 kg/m.   General: The patient is well-developed and well-nourished and in no acute distress   Skin: Extremities are without rash or edema.   Neurologic Exam  Mental status: The patient is alert and oriented x 3 at the time of the examination. The patient has apparent normal recent and remote memory, with an apparently normal attention span and concentration ability.   Speech is normal.  Cranial nerves: Extraocular movements are full. Pupils are equal, round, and reactive to light and accomodation.  Visual fields are full.  Facial symmetry is present. There is good facial sensation to soft touch bilaterally.Facial strength is normal.  Trapezius and sternocleidomastoid strength is normal. No dysarthria is noted.    No obvious hearing deficits are noted.  Motor:  Muscle bulk is normal.   Tone is normal. Strength is  5 / 5 in all 4 extremities.   Sensory: Sensory testing is intact to pinprick, soft touch and vibration sensation in all 4 extremities.  Coordination: Cerebellar testing reveals good finger-nose-finger and heel-to-shin bilaterally.  Gait and station: Station is normal.   Gait is slightly wide.  Tandem gait is wide.. Romberg is negative.   Reflexes: Deep tendon reflexes are symmetric but increased  with crossed adductor responses at the knees.   No ankle clonus.   Plantar responses are flexor.    DIAGNOSTIC DATA (LABS, IMAGING, TESTING) - I reviewed patient records, labs, notes, testing and imaging myself where available.  Lab Results  Component Value Date   WBC 8.2 06/16/2019   HGB 14.8 06/16/2019   HCT 42.4 06/16/2019   MCV 86 06/16/2019   PLT 205 06/16/2019      Component Value Date/Time   NA 139 03/31/2018 0802   K 3.8 03/31/2018 0802   CL 107 03/31/2018 0802   CO2 25 03/31/2018 0802   GLUCOSE 95 03/31/2018 0802   BUN 7 03/31/2018 0802   CREATININE 1.09 03/31/2018 0802   CALCIUM 8.5 (L) 03/31/2018 0802   PROT 6.3 06/16/2019 1454   ALBUMIN 4.3 06/16/2019 1454   AST 15 06/16/2019 1454   ALT 7 06/16/2019 1454   ALKPHOS 68 06/16/2019 1454   BILITOT 0.4 06/16/2019 1454   GFRNONAA >60 03/31/2018 0802   GFRAA >60 03/31/2018 0802       ASSESSMENT AND PLAN  Multiple sclerosis (HCC) - Plan: Ambulatory referral to Physical Therapy  Gait disturbance - Plan: Ambulatory referral to Physical Therapy  Depression with anxiety  1.    We discussed disease modifying therapy options.  He would like to be able to control his MS with diet alone and we discussed that a medication is absolutely necessary to better reduce future exacerbations and CNS lesions.  We will have him start Zeposia.  2.   He appears to have depression I will start sertraline.   3.   Stay active and exercise as tolerated. 4.   Return in 6 months or sooner for new or worsening neurologic symptoms.  Macauley Mossberg A. 04/02/2018, MD, [facility] 09/19/2021, 3:14 PM Certified in Neurology, Clinical Neurophysiology, Sleep Medicine, Pain Medicine and Neuroimaging  [facility] [address], [address] [place], [place] [phone]

## 2021-09-20 ENCOUNTER — Inpatient Hospital Stay (HOSPITAL_COMMUNITY)
Admission: EM | Admit: 2021-09-20 | Discharge: 2021-09-25 | DRG: 641 | Disposition: A | Discharge status: Home / Self Care | Payer: Self-pay | Attending: Internal Medicine | Admitting: Internal Medicine

## 2021-09-20 ENCOUNTER — Encounter (HOSPITAL_COMMUNITY): Payer: Self-pay

## 2021-09-20 ENCOUNTER — Other Ambulatory Visit: Payer: Self-pay

## 2021-09-20 DIAGNOSIS — Z809 Family history of malignant neoplasm, unspecified: Secondary | ICD-10-CM

## 2021-09-20 DIAGNOSIS — F329 Major depressive disorder, single episode, unspecified: Secondary | ICD-10-CM | POA: Diagnosis present

## 2021-09-20 DIAGNOSIS — Z8249 Family history of ischemic heart disease and other diseases of the circulatory system: Secondary | ICD-10-CM

## 2021-09-20 DIAGNOSIS — N182 Chronic kidney disease, stage 2 (mild): Secondary | ICD-10-CM | POA: Diagnosis present

## 2021-09-20 DIAGNOSIS — G35 Multiple sclerosis: Secondary | ICD-10-CM | POA: Diagnosis present

## 2021-09-20 DIAGNOSIS — E559 Vitamin D deficiency, unspecified: Secondary | ICD-10-CM | POA: Diagnosis present

## 2021-09-20 DIAGNOSIS — E860 Dehydration: Principal | ICD-10-CM | POA: Diagnosis present

## 2021-09-20 DIAGNOSIS — Z880 Allergy status to penicillin: Secondary | ICD-10-CM

## 2021-09-20 DIAGNOSIS — F17210 Nicotine dependence, cigarettes, uncomplicated: Secondary | ICD-10-CM | POA: Diagnosis present

## 2021-09-20 LAB — CBC
HCT: 50.2 % (ref 39.0–52.0)
Hemoglobin: 16.6 g/dL (ref 13.0–17.0)
MCH: 28.1 pg (ref 26.0–34.0)
MCHC: 33.1 g/dL (ref 30.0–36.0)
MCV: 85.1 fL (ref 80.0–100.0)
Platelets: 248 10*3/uL (ref 150–400)
RBC: 5.9 MIL/uL — ABNORMAL HIGH (ref 4.22–5.81)
RDW: 11.9 % (ref 11.5–15.5)
WBC: 8.6 10*3/uL (ref 4.0–10.5)
nRBC: 0 % (ref 0.0–0.2)

## 2021-09-20 LAB — COMPREHENSIVE METABOLIC PANEL
ALT: 9 U/L (ref 0–44)
AST: 16 U/L (ref 15–41)
Albumin: 4.8 g/dL (ref 3.5–5.0)
Alkaline Phosphatase: 64 U/L (ref 38–126)
Anion gap: 10 (ref 5–15)
BUN: 13 mg/dL (ref 6–20)
CO2: 28 mmol/L (ref 22–32)
Calcium: 9.8 mg/dL (ref 8.9–10.3)
Chloride: 97 mmol/L — ABNORMAL LOW (ref 98–111)
Creatinine, Ser: 1.31 mg/dL — ABNORMAL HIGH (ref 0.61–1.24)
GFR, Estimated: >60 mL/min (ref >60)
Glucose, Bld: 112 mg/dL — ABNORMAL HIGH (ref 70–99)
Potassium: 4.5 mmol/L (ref 3.5–5.1)
Sodium: 135 mmol/L (ref 135–145)
Total Bilirubin: 1.1 mg/dL (ref 0.3–1.2)
Total Protein: 7.6 g/dL (ref 6.5–8.1)

## 2021-09-20 LAB — LIPASE, BLOOD: Lipase: 29 U/L (ref 11–51)

## 2021-09-20 NOTE — ED Provider Triage Note (Signed)
Emergency Medicine Provider Triage Evaluation Note  Juan Mahoney , a 31 y.o. male  was evaluated in triage.  Pt complains of fatigue onset 2 weeks. Notes that he feels dehydrated, generalized abdominal cramping, nausea. No meds tried. No fever, diarrhea, emesis. Notes that he is compliant with his medications for his MS.   Review of Systems  Positive: As per HPI Negative:   Physical Exam  BP 130/88 (BP Location: Right Arm)   Pulse 74   Temp 98.7 F (37.1 C) (Oral)   Resp 16   Ht 5\' 9"  (1.753 m)   Wt 68 kg   SpO2 99%   BMI 22.15 kg/m  Gen:   Awake, no distress   Resp:  Normal effort  MSK:   Moves extremities without difficulty  Other:    Medical Decision Making  Medically screening exam initiated at 7:28 PM.  Appropriate orders placed.  Juan Mahoney Juan Mahoney was informed that the remainder of the evaluation will be completed by another provider, this initial triage assessment does not replace that evaluation, and the importance of remaining in the ED until their evaluation is complete.  Work-up initiated.    Juan Mahoney A, PA-C 09/20/21 1933

## 2021-09-20 NOTE — ED Triage Notes (Signed)
Pt reports fatigue, feeling dehydrated x 2 weeks and reports generalized abd pain/cramping that started today associated with nausea. Denies diarrhea/emesis.

## 2021-09-21 ENCOUNTER — Emergency Department (HOSPITAL_COMMUNITY): Payer: Self-pay

## 2021-09-21 ENCOUNTER — Inpatient Hospital Stay (HOSPITAL_COMMUNITY): Payer: Self-pay

## 2021-09-21 DIAGNOSIS — G379 Demyelinating disease of central nervous system, unspecified: Secondary | ICD-10-CM

## 2021-09-21 DIAGNOSIS — R531 Weakness: Secondary | ICD-10-CM

## 2021-09-21 DIAGNOSIS — R202 Paresthesia of skin: Secondary | ICD-10-CM

## 2021-09-21 DIAGNOSIS — G35 Multiple sclerosis: Secondary | ICD-10-CM | POA: Diagnosis present

## 2021-09-21 LAB — URINALYSIS, ROUTINE W REFLEX MICROSCOPIC
Bilirubin Urine: NEGATIVE
Glucose, UA: NEGATIVE mg/dL
Hgb urine dipstick: NEGATIVE
Ketones, ur: 5 mg/dL — AB
Leukocytes,Ua: NEGATIVE
Nitrite: NEGATIVE
Protein, ur: NEGATIVE mg/dL
Specific Gravity, Urine: 1.023 (ref 1.005–1.030)
pH: 6 (ref 5.0–8.0)

## 2021-09-21 MED ORDER — ONDANSETRON HCL 4 MG/2ML IJ SOLN: 4.0000 mg | Freq: Four times a day (QID) | INTRAMUSCULAR | Status: DC | PRN

## 2021-09-21 MED ORDER — GADOBUTROL 1 MMOL/ML IV SOLN
7.0000 mL | Freq: Once | INTRAVENOUS | Status: AC | PRN
  Administered 2021-09-21: 7 mL via INTRAVENOUS

## 2021-09-21 MED ORDER — SODIUM CHLORIDE 0.9 % IV SOLN
1000.0000 mg | Freq: Every day | INTRAVENOUS | Status: DC
  Administered 2021-09-22 – 2021-09-23 (×2): 1000 mg via INTRAVENOUS
  Filled 2021-09-21 (×3): qty 16

## 2021-09-21 MED ORDER — SODIUM CHLORIDE 0.9 % IV SOLN
1000.0000 mg | Freq: Once | INTRAVENOUS | Status: AC
  Administered 2021-09-21: 1000 mg via INTRAVENOUS
  Filled 2021-09-21: qty 16

## 2021-09-21 MED ORDER — ACETAMINOPHEN 325 MG PO TABS
650.0000 mg | ORAL_TABLET | Freq: Four times a day (QID) | ORAL | Status: DC | PRN
  Administered 2021-09-22: 650 mg via ORAL
  Filled 2021-09-21: qty 2

## 2021-09-21 MED ORDER — ENOXAPARIN SODIUM 40 MG/0.4ML IJ SOSY
40.0000 mg | PREFILLED_SYRINGE | INTRAMUSCULAR | Status: DC
  Administered 2021-09-21 – 2021-09-24 (×4): 40 mg via SUBCUTANEOUS
  Filled 2021-09-21 (×4): qty 0.4

## 2021-09-21 MED ORDER — POLYETHYLENE GLYCOL 3350 17 G PO PACK: 17.0000 g | PACK | Freq: Every day | ORAL | Status: DC | PRN

## 2021-09-21 MED ORDER — ONDANSETRON HCL 4 MG PO TABS: 4.0000 mg | ORAL_TABLET | Freq: Four times a day (QID) | ORAL | Status: DC | PRN

## 2021-09-21 MED ORDER — SODIUM CHLORIDE 0.9 % IV BOLUS
1000.0000 mL | Freq: Once | INTRAVENOUS | Status: AC
  Administered 2021-09-21: 1000 mL via INTRAVENOUS

## 2021-09-21 MED ORDER — ACETAMINOPHEN 650 MG RE SUPP: 650.0000 mg | Freq: Four times a day (QID) | RECTAL | Status: DC | PRN

## 2021-09-21 NOTE — ED Notes (Signed)
Pt provided toileting equipment, ambulatory to RR to utilize shower with no assistance. Linens changed, pt provided a warm blanket on return, hooked up to monitoring equipment.

## 2021-09-21 NOTE — H&P (Cosign Needed)
Date: 09/21/2021               Patient Name:  Juan Mahoney MRN: [id number]  DOB: 10-12-1990 Age / Sex: 31 y.o., male   PCP: Patient, No Pcp Per         Medical Service: Internal Medicine Teaching Service         Attending Physician: Dr. Mercie Eon, MD    First Contact: Dr. Ruben Im Pager: [phone]  Second Contact: Dr. Cyndie Chime  Pager: [phone]       After Hours (After 5p/  First Contact Pager: [phone]  weekends / holidays): Second Contact Pager: [phone]   Chief Complaint: Right-sided numbness  History of Present Illness:   Juan Mahoney is a 31 year old male with a history of multiple sclerosis presenting to the ED with complaints of right-sided numbness.  [name] states he has been experiencing right-sided numbness for approximately 3 weeks.  For the first week of his symptoms gradually worsened and has since been persistent without any major changes.  He feels that his right face and arm are predominantly affected.  He has been experiencing mild weakness of his right arm and his family told him he has been dragging his right leg.  He denies any left-sided numbness or weakness.  He denies any double vision, headaches, dizziness, shortness of breath, chest pain, nausea, vomiting, diarrhea, dysuria.  He denies any new medications at the time of symptom onset.  He did recently follow-up with his neurologist who started him on sertraline for depression and ozanimod for MS.  He has not received these medications yet as they were sent via mail.  Per chart review, patient presented to the [facility] ED on 09/06/2021 with symptoms of right-sided facial and scalp numbness.  At that time, neurological exam was reassuring.  He was recommended to follow-up with his neurologist.  He subsequently returned to the [facility] ED on 09/15/2021, at which time he received IV fluids.  Given he had already scheduled follow-up neurology, no further evaluation was pursued.  Per chart  review, patient was diagnosed with MS in 2018.  ED course: On arrival to the ED, patient was afebrile at 98.7.  He was normotensive at 130/88 with heart rate of 74.  He was saturating at 100% on room air.  Initial work-up including CBC and CMP are largely unremarkable compared to prior.  MRI brain and C-spine were obtained given history of MS, with results demonstrating progressive multifocal white matter lesions in the brain and multiple new lesions in the cervical and upper thoracic spinal cord.  Neurology was consulted for management of MS exacerbation.  IMTS was consulted for admission.  Meds:  No current facility-administered medications on file prior to encounter.   Current Outpatient Medications on File Prior to Encounter  Medication Sig Dispense Refill   ibuprofen (ADVIL) 800 MG tablet Take 1 tablet (800 mg total) by mouth every 8 (eight) hours as needed. (Patient not taking: Reported on 09/21/2021) 21 tablet 0   Ozanimod HCl 0.92 MG CAPS One po qd (Patient not taking: Reported on 09/21/2021) 30 capsule 11   sertraline (ZOLOFT) 50 MG tablet Take 1 tablet (50 mg total) by mouth daily. (Patient not taking: Reported on 09/21/2021) 30 tablet 11   Allergies: Allergies as of 09/20/2021 - Review Complete 09/20/2021  Allergen Reaction Noted   Amoxicillin  12/03/2013   Penicillin g Rash 09/05/2016   Past Medical History:  Diagnosis Date   Multiple sclerosis (HCC)  Dx 2018   Family History:  Family History  Problem Relation Age of Onset   Hypertension Mother    Cancer Maternal Grandmother    Social History:  Juan Mahoney lives in [place] with his parents He endorses tobacco use every other day, 1 or 2 cigarettes at a time He reports quitting smoking marijuana approximately 5 months ago Social alcohol use only  Review of Systems: A complete ROS was negative except as per HPI.   Physical Exam: Blood pressure 133/74, pulse 70, temperature 98.7 F (37.1 C), temperature source  Oral, resp. rate 15, height 5\' 9"  (1.753 m), weight 68 kg, SpO2 99 %.  Physical Exam Vitals and nursing note reviewed.  Constitutional:      General: He is not in acute distress.    Appearance: He is normal weight. He is not toxic-appearing.  HENT:     Head: Normocephalic and atraumatic.     Mouth/Throat:     Mouth: Mucous membranes are moist.     Pharynx: Oropharynx is clear.  Eyes:     Extraocular Movements: Extraocular movements intact.     Right eye: Normal extraocular motion and no nystagmus.     Left eye: Normal extraocular motion and no nystagmus.     Conjunctiva/sclera: Conjunctivae normal.     Pupils: Pupils are equal, round, and reactive to light.  Cardiovascular:     Rate and Rhythm: Normal rate and regular rhythm.     Heart sounds: No murmur heard.    No gallop.  Pulmonary:     Effort: Pulmonary effort is normal. No respiratory distress.     Breath sounds: Normal breath sounds. No wheezing, rhonchi or rales.  Abdominal:     General: Bowel sounds are normal. There is no distension.     Palpations: Abdomen is soft. There is no mass.     Tenderness: There is no abdominal tenderness. There is no guarding.  Musculoskeletal:        General: No tenderness or deformity.     Cervical back: Neck supple.     Right lower leg: No edema.     Left lower leg: No edema.  Skin:    General: Skin is warm and dry.     Findings: No lesion.  Neurological:     Mental Status: He is alert and oriented to person, place, and time.     Cranial Nerves: Cranial nerve deficit (Subjective altered sensation involving the V1 and V2 distribution of the trigeminal nerve) present. No dysarthria or facial asymmetry.     Sensory: Sensory deficit (Subjective altered sensation involving right dorsal hand) present.     Motor: Weakness (4/5 strength of the right upper extremity distally. No other weakness noted.) present. No tremor, atrophy or abnormal muscle tone.     Coordination: Finger-Nose-Finger Test  abnormal (mild dysmetria noted on the right only.). Heel to [facility] Test normal.  Psychiatric:        Mood and Affect: Mood normal.        Behavior: Behavior normal.        Thought Content: Thought content normal.        Judgment: Judgment normal.    Assessment & Plan by Problem: Principal Problem:   Multiple sclerosis exacerbation [facility])  Juan Mahoney is a 31 y/o male with multiple sclerosis not currently on maintenance therapy presenting with right sided numbness and admitted for MS exacerbation.   # Multiple Sclerosis Exacerbation  3 week history of right sided numbness predominantly affecting  the face and arm. MRI obtained on admission with multiple new lesions involving the C-spine, several of which are enhancing consistent with active demyelination. MRI brain with progressive lesions as well but non-enhancing. Neurological examination today with subjective right face and hand numbness, mild dysmetria and mild distal right arm weakness, however no severe deficits noted.   No evidence of infection per history or examination today.   - Neurology consulted; appreciate their recommendations - Start Solumedrol 1 g daily for 5 days  - Will need to ensure patient is able to obtain Ozanimod to prior discharge, prescribed by his primary neurologist earlier this week - CBG monitoring while on high dose steroids   # Chronic Kidney Disease, Stage 2 Creatinine has consistently fluctuated between 1.16 - 1.41 since 2018. Currently 1.31, however it was previously 1.22 --> 1.18 over the last 2 weeks. Given history of college athletics, may reflect higher than average muscle tone. However will order renal ultrasound to rule out hydronephrosis given risk for neurogenic bladder, as well as evaluate for medical renal disease. Will obtain urinalysis for evaluation of sediment.   - Renal ultrasound pending - Urinalysis pending   # Depression Recently diagnosed by Neurology and prescribed Sertraline,  however he has not picked it up yet.   - Hold off on starting Sertraline for now   Diet: Normal VTE: Enoxaparin IVF: None,None Code: Full  Prior to Admission Living Arrangement: Home, living with his parents Anticipated Discharge Location: Home Barriers to Discharge: IV medication therapy   Dispo: Admit patient to Inpatient with expected length of stay greater than 2 midnights.  Signed: Dr. Verdene Lennert Internal Medicine PGY-3 Pager: [phone] After 5pm on weekdays and 1pm on weekends: On Call pager [phone]  09/21/2021, 1:58 PM

## 2021-09-21 NOTE — Consult Note (Addendum)
NEUROLOGY CONSULTATION NOTE   Date of service: September 21, 2021 Patient Name: Juan Mahoney MRN:  [id number] DOB:  1991/01/23 Reason for consult: MS flare Requesting physician: Dr. Raynaldo Opitz _ _ _   _ __   _ __ _ _  __ __   _ __   __ _  History of Present Illness   This is a 31 year old man with a history of multiple sclerosis who presents to the ED with complaints of worsening right-sided weakness and numbness for approximately last 3 weeks.  He states that prior to this he was able to run and play basketball for extended length of time.  He has not been able to do this for approximately 3 weeks and the symptoms are getting worse.  He notices the numbness most prominently in the right face and right arm although he has also had some weakness of his right leg again preventing him from running and he has also been told by his family that he has been dragging his right leg.  He was followed by Dr. Epimenio Foot and then lost to follow-up and review presented for follow-up 2 days ago for the first time in 2 years.  Dr. Sherol Dade started him on sertraline for depression and ozanimod for MS however neither has yet been filled.  MRI brain:   1. Multifocal white matter lesions within the supratentorial and infratentorial brain, as described and having progressed from the prior brain MRI of 04/27/2018. Findings are compatible with the provided history of demyelinating disease. No pathologic intracranial enhancement to suggest active demyelination. 2. Mild paranasal sinus disease, as described.   MRI cervical spine:   1. Intermittently motion degraded examination. 2. Multiple lesions within the cervical and upper thoracic spinal cord, as detailed and progressed in number from the prior MRI of 04/27/2018. Enhancement associated with lesions at the C1-C2, C2-C3 and C4-C5 levels, consistent with active demyelination. 3. Cervical spondylosis, as outlined. No more than mild relative spinal canal  narrowing. No significant foraminal stenosis.  This imaging personally reviewed; I agree with above interpretations.   ROS   Per HPI: all other systems reviewed and are negative  Past History   I have reviewed the following:  Past Medical History:  Diagnosis Date   Multiple sclerosis (HCC)    Dx 2018   Past Surgical History:  Procedure Laterality Date   ARTHROSCOPIC REPAIR ACL Right    Family History  Problem Relation Age of Onset   Hypertension Mother    Cancer Maternal Grandmother    Social History   Socioeconomic History   Marital status: Single    Spouse name: Not on file   Number of children: Not on file   Years of education: Not on file   Highest education level: Not on file  Occupational History   Not on file  Tobacco Use   Smoking status: Never   Smokeless tobacco: Never  Vaping Use   Vaping Use: Never used  Substance and Sexual Activity   Alcohol use: No   Drug use: Not Currently    Types: Marijuana   Sexual activity: Not Currently  Other Topics Concern   Not on file  Social History Narrative   Lives   Caffeine use: Soda sometimes    Right handed    Social Determinants of Health   Financial Resource Strain: Not on file  Food Insecurity: Not on file  Transportation Needs: Not on file  Physical Activity: Not on file  Stress: Not on  file  Social Connections: Not on file   Allergies  Allergen Reactions   Amoxicillin Itching and Rash   Penicillin G Rash    DID THE REACTION INVOLVE: Swelling of the face/tongue/throat, SOB, or low BP? No Sudden or severe rash/hives, skin peeling, or the inside of the mouth or nose? Yes Did it require medical treatment? No When did it last happen?      2015 If all above answers are "NO", may proceed with cephalosporin use.     Medications   (Not in a hospital admission)     Current Facility-Administered Medications:    acetaminophen (TYLENOL) tablet 650 mg, 650 mg, Oral, Q6H PRN **OR** acetaminophen  (TYLENOL) suppository 650 mg, 650 mg, Rectal, Q6H PRN, Verdene Lennert, MD   enoxaparin (LOVENOX) injection 40 mg, 40 mg, Subcutaneous, Q24H, Verdene Lennert, MD   [START ON 09/22/2021] methylPREDNISolone sodium succinate (SOLU-MEDROL) 1,000 mg in sodium chloride 0.9 % 50 mL IVPB, 1,000 mg, Intravenous, Daily, Verdene Lennert, MD   ondansetron (ZOFRAN) tablet 4 mg, 4 mg, Oral, Q6H PRN **OR** ondansetron (ZOFRAN) injection 4 mg, 4 mg, Intravenous, Q6H PRN, Verdene Lennert, MD   polyethylene glycol (MIRALAX / GLYCOLAX) packet 17 g, 17 g, Oral, Daily PRN, Verdene Lennert, MD  Current Outpatient Medications:    ibuprofen (ADVIL) 800 MG tablet, Take 1 tablet (800 mg total) by mouth every 8 (eight) hours as needed. (Patient not taking: Reported on 09/21/2021), Disp: 21 tablet, Rfl: 0   Ozanimod HCl 0.92 MG CAPS, One po qd (Patient not taking: Reported on 09/21/2021), Disp: 30 capsule, Rfl: 11   sertraline (ZOLOFT) 50 MG tablet, Take 1 tablet (50 mg total) by mouth daily. (Patient not taking: Reported on 09/21/2021), Disp: 30 tablet, Rfl: 11  Vitals   Vitals:   09/21/21 1500 09/21/21 1515 09/21/21 1530 09/21/21 1920  BP: 128/72 116/68 (!) 127/41 116/70  Pulse: 70 67 65 69  Resp: 10 14 19 16   Temp:      TempSrc:      SpO2: 100% 97% 98% 97%  Weight:      Height:         Body mass index is 22.15 kg/m.  Physical Exam   Physical Exam Gen: A&O x4, NAD HEENT: Atraumatic, normocephalic;mucous membranes moist; oropharynx clear, tongue without atrophy or fasciculations. Neck: Supple, trachea midline. Resp: CTAB, no w/r/r CV: RRR, no m/g/r; nml S1 and S2. 2+ symmetric peripheral pulses. Abd: soft/NT/ND; nabs x 4 quad Extrem: Nml bulk; no cyanosis, clubbing, or edema.  Neuro: *MS: A&O x4. Follows multi-step commands.  *Speech: fluid, nondysarthric, able to name and repeat *CN:    I: Deferred   II,III: PERRLA, VFF by confrontation, optic discs unable to be visualized 2/2 pupillary constriction    III,IV,VI: EOMI w/o nystagmus, no ptosis   V: numbness R face to PP   VII: Eyelid closure was full.  Smile symmetric.   VIII: Hearing intact to voice   IX,X: Voice normal, palate elevates symmetrically    XI: SCM/trap 5/5 bilat   XII: Tongue protrudes midline, no atrophy or fasciculations   *Motor:   Normal bulk.  No tremor, rigidity or bradykinesia. No pronator drift.    Strength: Dlt Bic Tri WrE WrF FgS Gr HF KnF KnE PlF DoF    Left 5 5 5 5 5 5 5 5 5 5 5 5     Right 4 4+ 5 5 5 5 5  4+ 5 5 5 5     *Sensory: Impaired to PP RUE.  Propioception intact bilat.  No double-simultaneous extinction.  *Coordination:  Finger-to-nose, heel-to-shin, rapid alternating motions were intact. *Reflexes:  2+ brisk and symmetric throughout without clonus; toes down-going bilat *Gait: deferred   Labs   CBC:  Recent Labs  Lab 09/20/21 1909  WBC 8.6  HGB 16.6  HCT 50.2  MCV 85.1  PLT 248    Basic Metabolic Panel:  Lab Results  Component Value Date   NA 135 09/20/2021   K 4.5 09/20/2021   CO2 28 09/20/2021   GLUCOSE 112 (H) 09/20/2021   BUN 13 09/20/2021   CREATININE 1.31 (H) 09/20/2021   CALCIUM 9.8 09/20/2021   GFRNONAA >60 09/20/2021   GFRAA >60 03/31/2018   Lipid Panel: No results found for: "LDLCALC" HgbA1c: No results found for: "HGBA1C" Urine Drug Screen: No results found for: "LABOPIA", "COCAINSCRNUR", "LABBENZ", "AMPHETMU", "THCU", "LABBARB"  Alcohol Level No results found for: "ETH"   Impression   This is a 31 year old man with a history of multiple sclerosis who presents to the ED with complaints of worsening right-sided weakness and numbness for approximately last 3 weeks w/ e/o active demyelination in the cervical cord c/w MS flare.  Recommendations   - Admit to hospitalist service - MRI t spine wwo after 24 hrs from last gad admin - IV solumedrol 1000mg  q 24 hrs x5 days end date 6/25 - I had a very long conversation with patient about the importance of disease  modifying therapy in the setting of significantly active disease. He has been trying to control his MS with diet, and I encouraged him to continue to explore this but strongly advised him that he would also need to be on a medication as well to control the flares longterm. Will continue to counsel him on this during the hospitalization. - PT/OT - Will continue to follow ______________________________________________________________________   Thank you for the opportunity to take part in the care of this patient. If you have any further questions, please contact the neurology consultation attending.  Signed,  7/25, MD [facility] [phone]  If 7pm- 7am, please page neurology on call as listed in AMION.

## 2021-09-21 NOTE — ED Provider Notes (Signed)
Pt signed out by Dr. Madilyn Hook pending MRI results.  MRI brain and cervical spine results:  IMPRESSION:  MRI brain:    1. Multifocal white matter lesions within the supratentorial and  infratentorial brain, as described and having progressed from the  prior brain MRI of 04/27/2018. Findings are compatible with the  provided history of demyelinating disease. No pathologic  intracranial enhancement to suggest active demyelination.  2. Mild paranasal sinus disease, as described.    MRI cervical spine:    1. Intermittently motion degraded examination.  2. Multiple lesions within the cervical and upper thoracic spinal  cord, as detailed and progressed in number from the prior MRI of  04/27/2018. Enhancement associated with lesions at the C1-C2, C2-C3  and C4-C5 levels, consistent with active demyelination.  3. Cervical spondylosis, as outlined. No more than mild relative  spinal canal narrowing. No significant foraminal stenosis.    Films reviewed.  I agree with the radiologist.  I spoke with Dr. Selina Cooley (Neurology).  She recommends medicine admission.  Neurology will consult.  She recommends solumedrol 1 g iv daily for 5 days.  Pt does not have a PCP, so I spoke with IMT[facility] for admission.   Jacalyn Lefevre, MD 09/21/21 1209

## 2021-09-21 NOTE — ED Provider Notes (Signed)
[facility] EMERGENCY DEPARTMENT Provider Note   CSN: [id number] Arrival date & time: 09/20/21  1828     History  Chief Complaint  Patient presents with   Fatigue    Juan Mahoney is a 31 y.o. male.  The history is provided by the patient and medical records.  Juan Mahoney is a 31 y.o. male who presents to the Emergency Department complaining of fatigue, dehydration.  He has a hx/o MS, feels like he is dehydrated and is concerned for an MS flare.  He has been experiencing increased fatigue and two weeks of right upper arm weakness now today has numbness in his fingers, which is new.  He is drinking water but feels fatigued and dehydrated.  He saw neurology two days ago and has a new MS med prescribed but it has not arrived to his home yet.  Has mild abdominal discomfort and nausea but no vomiting, fever, diarrhea, dysuria.       Home Medications Prior to Admission medications   Medication Sig Start Date End Date Taking? Authorizing Provider  ibuprofen (ADVIL) 800 MG tablet Take 1 tablet (800 mg total) by mouth every 8 (eight) hours as needed. 12/02/20   Long, Arlyss Repress, MD  Ozanimod HCl 0.92 MG CAPS One po qd 09/19/21   Sater, Pearletha Furl, MD  sertraline (ZOLOFT) 50 MG tablet Take 1 tablet (50 mg total) by mouth daily. 09/19/21   Sater, Pearletha Furl, MD      Allergies    Amoxicillin and Penicillin g    Review of Systems   Review of Systems  All other systems reviewed and are negative.   Physical Exam Updated Vital Signs BP 127/81   Pulse 73   Temp 98.7 F (37.1 C) (Oral)   Resp 14   Ht 5\' 9"  (1.753 m)   Wt 68 kg   SpO2 99%   BMI 22.15 kg/m  Physical Exam Vitals and nursing note reviewed.  Constitutional:      Appearance: He is well-developed.  HENT:     Head: Normocephalic and atraumatic.  Cardiovascular:     Rate and Rhythm: Normal rate and regular rhythm.     Heart sounds: No murmur heard. Pulmonary:     Effort:  Pulmonary effort is normal. No respiratory distress.     Breath sounds: Normal breath sounds.  Abdominal:     Palpations: Abdomen is soft.     Tenderness: There is no abdominal tenderness. There is no guarding or rebound.  Musculoskeletal:        General: No swelling or tenderness.     Comments: 5/5 strength in proximal and distal muscle groups of BUE.  5/5 strength in BLE.  Altered sensation to light touch throughout RUE.    Skin:    General: Skin is warm and dry.  Neurological:     Mental Status: He is alert and oriented to person, place, and time.  Psychiatric:        Behavior: Behavior normal.     ED Results / Procedures / Treatments   Labs (all labs ordered are listed, but only abnormal results are displayed) Labs Reviewed  COMPREHENSIVE METABOLIC PANEL - Abnormal; Notable for the following components:      Result Value   Chloride 97 (*)    Glucose, Bld 112 (*)    Creatinine, Ser 1.31 (*)    All other components within normal limits  CBC - Abnormal; Notable for the following components:  RBC 5.90 (*)    All other components within normal limits  LIPASE, BLOOD  URINALYSIS, ROUTINE W REFLEX MICROSCOPIC    EKG None  Radiology No results found.  Procedures Procedures    Medications Ordered in ED Medications  sodium chloride 0.9 % bolus 1,000 mL (1,000 mLs Intravenous New Bag/Given 09/21/21 0700)    ED Course/ Medical Decision Making/ A&P                           Medical Decision Making Amount and/or Complexity of Data Reviewed Labs: ordered. Radiology: ordered.   Pt with hx/o MS here for evaluation of progressive numbness to the RUE.  This is his third ED visit in June, recently saw Neurology.  Most recent MRI is from 2020.  Given progressive sxs will check MRI to evaluate for progressive lesions.  Care transferred pending imaging.          Final Clinical Impression(s) / ED Diagnoses Final diagnoses:  None    Rx / DC Orders ED Discharge Orders      None         Tilden Fossa, MD 09/21/21 [phone]

## 2021-09-21 NOTE — ED Notes (Signed)
Patient transported to MRI 

## 2021-09-22 ENCOUNTER — Encounter (HOSPITAL_COMMUNITY): Payer: Self-pay | Admitting: Internal Medicine

## 2021-09-22 ENCOUNTER — Inpatient Hospital Stay (HOSPITAL_COMMUNITY): Payer: Self-pay

## 2021-09-22 DIAGNOSIS — F329 Major depressive disorder, single episode, unspecified: Secondary | ICD-10-CM

## 2021-09-22 DIAGNOSIS — R29898 Other symptoms and signs involving the musculoskeletal system: Secondary | ICD-10-CM

## 2021-09-22 LAB — BASIC METABOLIC PANEL
Anion gap: 8 (ref 5–15)
BUN: 9 mg/dL (ref 6–20)
CO2: 25 mmol/L (ref 22–32)
Calcium: 9.2 mg/dL (ref 8.9–10.3)
Chloride: 101 mmol/L (ref 98–111)
Creatinine, Ser: 1.08 mg/dL (ref 0.61–1.24)
GFR, Estimated: >60 mL/min (ref >60)
Glucose, Bld: 129 mg/dL — ABNORMAL HIGH (ref 70–99)
Potassium: 3.8 mmol/L (ref 3.5–5.1)
Sodium: 134 mmol/L — ABNORMAL LOW (ref 135–145)

## 2021-09-22 LAB — CBC WITH DIFFERENTIAL/PLATELET
Abs Immature Granulocytes: 0.06 10*3/uL (ref 0.00–0.07)
Basophils Absolute: 0 10*3/uL (ref 0.0–0.1)
Basophils Relative: 0 %
Eosinophils Absolute: 0 10*3/uL (ref 0.0–0.5)
Eosinophils Relative: 0 %
HCT: 47.7 % (ref 39.0–52.0)
Hemoglobin: 16.3 g/dL (ref 13.0–17.0)
Immature Granulocytes: 0 %
Lymphocytes Relative: 8 %
Lymphs Abs: 1 10*3/uL (ref 0.7–4.0)
MCH: 28.5 pg (ref 26.0–34.0)
MCHC: 34.2 g/dL (ref 30.0–36.0)
MCV: 83.4 fL (ref 80.0–100.0)
Monocytes Absolute: 0.4 10*3/uL (ref 0.1–1.0)
Monocytes Relative: 3 %
Neutro Abs: 11.9 10*3/uL — ABNORMAL HIGH (ref 1.7–7.7)
Neutrophils Relative %: 89 %
Platelets: 220 10*3/uL (ref 150–400)
RBC: 5.72 MIL/uL (ref 4.22–5.81)
RDW: 11.9 % (ref 11.5–15.5)
WBC: 13.4 10*3/uL — ABNORMAL HIGH (ref 4.0–10.5)
nRBC: 0 % (ref 0.0–0.2)

## 2021-09-22 LAB — GLUCOSE, CAPILLARY: Glucose-Capillary: 169 mg/dL — ABNORMAL HIGH (ref 70–99)

## 2021-09-22 MED ORDER — GADOBUTROL 1 MMOL/ML IV SOLN
7.0000 mL | Freq: Once | INTRAVENOUS | Status: AC | PRN
  Administered 2021-09-22: 7 mL via INTRAVENOUS

## 2021-09-22 MED ORDER — SERTRALINE HCL 50 MG PO TABS
50.0000 mg | ORAL_TABLET | Freq: Every day | ORAL | Status: DC
  Administered 2021-09-22 – 2021-09-24 (×2): 50 mg via ORAL
  Filled 2021-09-22 (×4): qty 1

## 2021-09-22 NOTE — Hospital Course (Signed)
Not as tired. A little more balanced. Less weakness. Right first 2 digit numbness and right facial numbness.  PT recommends outpatient PT

## 2021-09-22 NOTE — Progress Notes (Signed)
Pt is off the unit to MRI.  

## 2021-09-22 NOTE — Progress Notes (Deleted)
Subjective: Patient reports that he is feeling better after his first two doses of solumedrol.  He continues to have slight right deltoid and hip flexor weakness.  Plan for three more doses of solumedrol and probable discharge on Sunday after last dose.  Exam: Vitals:   09/22/21 1127 09/22/21 1452  BP: 128/74 126/72  Pulse: 69 70  Resp: 17 18  Temp: 98.3 F (36.8 C) 97.8 F (36.6 C)  SpO2: 100% 100%   Gen: In bed, NAD Resp: non-labored breathing, no acute distress  Neuro: MS: Alert and oriented x4, follows multi-step commands CN: II: PERRL, no APD, visual fields full III, IV, VI: EOMI VII: smile symmetric, full eyelid closure VIII: hearing intact to voice IX, X: Phonation normal XI: shoulder shrug 5/5 XII: tongue protrudes midline Motor: Normal bulk and tone, 4+/5 to right deltoid and hip flexor, 5/5 to all other areas Sensory:Intact to light touch bilaterally DTR:2+ brisk and symmetric without clonus, toes down-going bilaterally Gait: deferred  Pertinent Labs: CBC    Component Value Date/Time   WBC 13.4 (H) 09/22/2021 0353   RBC 5.72 09/22/2021 0353   HGB 16.3 09/22/2021 0353   HGB 14.8 06/16/2019 1454   HCT 47.7 09/22/2021 0353   HCT 42.4 06/16/2019 1454   PLT 220 09/22/2021 0353   PLT 205 06/16/2019 1454   MCV 83.4 09/22/2021 0353   MCV 86 06/16/2019 1454   MCH 28.5 09/22/2021 0353   MCHC 34.2 09/22/2021 0353   RDW 11.9 09/22/2021 0353   RDW 13.1 06/16/2019 1454   LYMPHSABS 1.0 09/22/2021 0353   LYMPHSABS 2.3 06/16/2019 1454   MONOABS 0.4 09/22/2021 0353   EOSABS 0.0 09/22/2021 0353   EOSABS 0.2 06/16/2019 1454   BASOSABS 0.0 09/22/2021 0353   BASOSABS 0.1 06/16/2019 1454       Latest Ref Rng & Units 09/22/2021    3:53 AM 09/20/2021    7:09 PM 03/31/2018    8:02 AM  BMP  Glucose 70 - 99 mg/dL 947  096  95   BUN 6 - 20 mg/dL 9  13  7    Creatinine 0.61 - 1.24 mg/dL  2.83  6.62   Sodium 135 - 145 mmol/L 134  135  139   Potassium 3.5 - 5.1 mmol/L  3.8  4.5  3.8   Chloride 98 - 111 mmol/L 101  97  107   CO2 22 - 32 mmol/L 25  28  25    Calcium 8.9 - 10.3 mg/dL 9.2  9.8  8.5      Impression: 31 year old patient with history of MS who was admitted with worsening right sided weakness and numbness for the last three weeks.  MRI shows evidence of active demyelination in cervical spinal cord.  He has been trying to control his disease with diet but has been strongly encouraged to use disease-modifying medications as well.    Recommendations: 1)Continue IV solumedrol for five total doses 2)PT/OT 3)Encourage use of disease modifying medications on an outpatient basis 4)Neurology will continue to follow   E , MSN, AGACNP-BC [facility] See Amion for schedule and pager information 09/22/2021 2:58 PM

## 2021-09-22 NOTE — Evaluation (Signed)
Physical Therapy Evaluation & Discharge Patient Details Name: Juan Mahoney MRN: [id number] DOB: 30-Mar-1991 Today's Date: 09/22/2021  History of Present Illness  Pt is a 31 y.o. old male who presented 09/20/21 due to Ms exacerbartion and with complaints of R side numbness. MRI showed progressive lesions.  PMH: MS, ACL repair   Clinical Impression  Pt presents with condition above. PTA, he was IND without DME, living with his cousin in an apartment with 2 flights to enter/exit, and playing basketball and football. Currently, pt displays slight R upper and lower extremity weakness compared to his L side, but pt reports it has improved since yesterday. Pt also with R face and R index and thumb finger tip numbness. Pt reports his balance and handwriting have improved since yesterday also, now demonstrating mod I-IND mobility without LOB or need for UE support. Educated pt on resources for MS and managing his symptoms. Recommending OPPT to try to assist in managing his symptoms. All education completed and questions answered, no further acute PT needs identified, will sign off.     Recommendations for follow up therapy are one component of a multi-disciplinary discharge planning process, led by the attending physician.  Recommendations may be updated based on patient status, additional functional criteria and insurance authorization.  Follow Up Recommendations Outpatient PT (neuro clinic)      Assistance Recommended at Discharge PRN  Patient can return home with the following   (N/A)    Equipment Recommendations None recommended by PT  Recommendations for Other Services       Functional Status Assessment Patient has had a recent decline in their functional status and demonstrates the ability to make significant improvements in function in a reasonable and predictable amount of time.     Precautions / Restrictions Precautions Precautions: None Restrictions Weight Bearing  Restrictions: No      Mobility  Bed Mobility Overal bed mobility: Independent             General bed mobility comments: No need for assistance for all bed mobility aspects.    Transfers Overall transfer level: Independent Equipment used: None               General transfer comment: Able to stand without LOB or need for assistance    Ambulation/Gait Ambulation/Gait assistance: Modified independent (Device/Increase time) Gait Distance (Feet): 300 Feet Assistive device: None Gait Pattern/deviations: Step-through pattern, Decreased stride length, Decreased dorsiflexion - right Gait velocity: reduced Gait velocity interpretation: >2.62 ft/sec, indicative of community ambulatory   General Gait Details: Pt with slow, cautious gait pattern initially, but as distance progressed so did his speed and fluidity. Pt with slightly decreased R dorsiflexion with swing compared to L but no LOB.  Stairs Stairs: Yes Stairs assistance: Supervision Stair Management: No rails, Alternating pattern, Forwards Number of Stairs: 10 General stair comments: Ascends and descends without rail support, LOB, or assistance. Supervision for safety only.  Wheelchair Mobility    Modified Rankin (Stroke Patients Only) Modified Rankin (Stroke Patients Only) Pre-Morbid Rankin Score: No symptoms Modified Rankin: No significant disability     Balance Overall balance assessment: No apparent balance deficits (not formally assessed) (can do jumping jacks without LOB)                                           Pertinent Vitals/Pain Pain Assessment Pain Assessment: Faces Faces Pain  Scale: No hurt Pain Intervention(s): Monitored during session    Home Living Family/patient expects to be discharged to:: Private residence Living Arrangements: Other relatives (cousin) Available Help at Discharge: Family;Available PRN/intermittently Type of Home: Apartment Home Access: Stairs to  enter Entrance Stairs-Rails: Right;Left Entrance Stairs-Number of Steps: 2 flights   Home Layout: One level Home Equipment: None      Prior Function Prior Level of Function : Independent/Modified Independent;Driving;Working/employed               ADLs Comments: Likes to play basketball and football     Hand Dominance   Dominant Hand: Right    Extremity/Trunk Assessment   Upper Extremity Assessment Upper Extremity Assessment: RUE deficits/detail RUE Deficits / Details: Slightly weaker (MMT scores of 4+ to 5 grossly) than his L side (MMT scores of 5 grossly); numbness noted at R thumb and index finger tips and R face RUE Sensation: decreased light touch RUE Coordination: WNL    Lower Extremity Assessment Lower Extremity Assessment: RLE deficits/detail RLE Deficits / Details: Mild weakness (MMT scores of 4+ to 5 grossly) noted compared to L side (MMT scores fo 5 grossly); denied numbness/tingling throughout RLE Sensation: WNL RLE Coordination: WNL    Cervical / Trunk Assessment Cervical / Trunk Assessment: Normal  Communication   Communication: No difficulties  Cognition Arousal/Alertness: Awake/alert Behavior During Therapy: WFL for tasks assessed/performed Overall Cognitive Status: Within Functional Limits for tasks assessed                                 General Comments: Aware of his deficits        General Comments General comments (skin integrity, edema, etc.): educated pt on moderate intensity exercise and temperature regulation to manage symptoms; provided education on OPPT and estim along with resources by [email or web address] to help manage symptoms or provide support group also    Exercises     Assessment/Plan    PT Assessment All further PT needs can be met in the next venue of care  PT Problem List Decreased strength;Decreased mobility;Impaired sensation       PT Treatment Interventions      PT Goals (Current goals can be found in the  Care Plan section)  Acute Rehab PT Goals Patient Stated Goal: to try estim to manage his symptoms PT Goal Formulation: All assessment and education complete, DC therapy Time For Goal Achievement: 09/23/21 Potential to Achieve Goals: Good    Frequency       Co-evaluation               AM-PAC PT "6 Clicks" Mobility  Outcome Measure Help needed turning from your back to your side while in a flat bed without using bedrails?: None Help needed moving from lying on your back to sitting on the side of a flat bed without using bedrails?: None Help needed moving to and from a bed to a chair (including a wheelchair)?: None Help needed standing up from a chair using your arms (e.g., wheelchair or bedside chair)?: None Help needed to walk in hospital room?: None Help needed climbing 3-5 steps with a railing? : A Little 6 Click Score: 23    End of Session   Activity Tolerance: Patient tolerated treatment well Patient left: in chair;with call bell/phone within reach Nurse Communication: Mobility status PT Visit Diagnosis: Other abnormalities of gait and mobility (R26.89);Muscle weakness (generalized) (M62.81);Other symptoms and signs involving the nervous system (  R29.898)    Time: 8182-9937 PT Time Calculation (min) (ACUTE ONLY): 15 min   Charges:   PT Evaluation $PT Eval Low Complexity: 1 Low          Moishe Spice, PT, DPT Acute Rehabilitation Services  Office: [phone]   Orvan Falconer 09/22/2021, 8:39 AM

## 2021-09-22 NOTE — TOC Progression Note (Signed)
Transition of Care [facility]) - Progression Note    Patient Details  Name: Juan Mahoney MRN: [id number] Date of Birth: 1990-07-05  [facility]) CM/SW Contact  Beckie Busing, RN Phone Number:[phone]  09/22/2021, 3:24 PM  Clinical Narrative:    [facility] acknowledges consult for patient with no PCP. This is  31 year old male with a history of multiple sclerosis presenting to the ED with complaints of right-sided numbness. TOC will follow and assist with PCP setup when patients discharge plan is identified.           Expected Discharge Plan and Services                                                 Social Determinants of Health (SDOH) Interventions    Readmission Risk Interventions     No data to display

## 2021-09-22 NOTE — Progress Notes (Signed)
OT Cancellation Note  Patient Details Name: Juan Mahoney MRN: [id number] DOB: 1991-03-23   Cancelled Treatment:    Reason Eval/Treat Not Completed: OT screened, no needs identified, will sign off. Spoke with Physical Therapist and the patient and reported no other needs at this time  Alphia Moh OTR/L  Acute Rehab Services  [phone] office number [phone] pager number   Alphia Moh 09/22/2021, 11:50 AM

## 2021-09-22 NOTE — Progress Notes (Signed)
Pt's father called to voice concerns of pt's compliance to treatment states that pt has been hospitalized several times and while compliant in the hospital he continues to be noncompliant with treat outpt. Pt father also voiced concern about pt's depression requesting pt be evaluated by psych. MD notified of concerns.

## 2021-09-23 DIAGNOSIS — F321 Major depressive disorder, single episode, moderate: Secondary | ICD-10-CM

## 2021-09-23 LAB — GLUCOSE, CAPILLARY: Glucose-Capillary: 179 mg/dL — ABNORMAL HIGH (ref 70–99)

## 2021-09-23 LAB — HIV ANTIBODY (ROUTINE TESTING W REFLEX): HIV Screen 4th Generation wRfx: NONREACTIVE

## 2021-09-24 DIAGNOSIS — E559 Vitamin D deficiency, unspecified: Secondary | ICD-10-CM

## 2021-09-24 LAB — GLUCOSE, CAPILLARY
Glucose-Capillary: 136 mg/dL — ABNORMAL HIGH (ref 70–99)
Glucose-Capillary: 138 mg/dL — ABNORMAL HIGH (ref 70–99)

## 2021-09-24 MED ORDER — VITAMIN D (ERGOCALCIFEROL) 1.25 MG (50000 UNIT) PO CAPS
50000.0000 [IU] | ORAL_CAPSULE | ORAL | Status: DC
Start: 2021-09-24 — End: 2021-09-25
  Administered 2021-09-24: 50000 [IU] via ORAL
  Filled 2021-09-24 (×2): qty 1

## 2021-09-24 MED ORDER — SODIUM CHLORIDE 0.9 % IV SOLN
1000.0000 mg | Freq: Every day | INTRAVENOUS | Status: AC
  Administered 2021-09-24 – 2021-09-25 (×2): 1000 mg via INTRAVENOUS
  Filled 2021-09-24 (×2): qty 16

## 2021-09-24 NOTE — Progress Notes (Addendum)
   Subjective: Patient seen and evaluated at bedside.  -Strength improving. Still have diminished sensation in thumb and right side of face but improving overall. Coordination is getting better. 90% back to baseline.  Discussed possible discharge with patient tomorrow.   Objective:  Vital signs in last 24 hours: Vitals:   09/23/21 1936 09/23/21 2316 09/24/21 0405 09/24/21 0759  BP: 129/76 133/70 124/71 128/75  Pulse: 68 82 74 74  Resp:  17 16 18   Temp: 97.7 F (36.5 C) 98.2 F (36.8 C) 98.2 F (36.8 C) 98.7 F (37.1 C)  TempSrc: Oral Oral Oral Oral  SpO2: 97% 95% 96% 98%  Weight:      Height:       Physical Exam Constitutional:      General: He is not in acute distress. Cardiovascular:     Rate and Rhythm: Normal rate.     Heart sounds: Normal heart sounds.  Pulmonary:     Effort: Pulmonary effort is normal.     Breath sounds: Normal breath sounds and air entry.  Abdominal:     Palpations: Abdomen is soft.  Musculoskeletal:     Right lower leg: No edema.     Left lower leg: No edema.  Neurological:     Mental Status: He is alert.     Cranial Nerves: No dysarthria or facial asymmetry.     Sensory: Subjective decrease in sensation right side face and right thumb    Motor: Motor function is intact. No weakness.     Gait: Gait is intact.  Psychiatric:        Behavior: Behavior is cooperative.        Cognition and Memory: Cognition normal.    Assessment/Plan:  Principal Problem:   Multiple sclerosis exacerbation (HCC) Active Problems:   Current episode of major depressive disorder without prior episode  [name] is a 31 y/o male with multiple sclerosis not currently on maintenance therapy presenting with right sided numbness and admitted for MS exacerbation.  Multiple sclerosis exacerbation MRI T-spine revealing demyelinating plaques of T9 level with possible plaques of C7 and T10-11.  No enhancing lesions or active demyelination appreciated.  Patient  reports subjective improvement in strength and sensation.  Does endorse continued subjective decrease in sensation in her right thumb and right side of face.  Strength back approximately 90% per patient.  No noticeable deficits on physical exam.  Neurology has been following patient - Solu-Medrol 1 g IV x5 days; end date 09/25/2021.  Patient can be discharged after last dose and final neuro evaluation.. - Patient has DMARD at home, confirmed with patient's father.  He will be on ozanimod and will need to follow-up with Dr. Epimenio Foot. -- patient will need PCP upon discharge.  TOC following  Depression Patient previously declined sertraline due to upset stomach and diarrhea.  Day team discussed with him the side effects should abate after roughly 1 week, and patient was agreeable to resuming the medication.  No current SI/HI -- Sertraline 50mg  daily   Low vitamin D 6.8. Received 50,000 units vitamin D.  This will need to be rechecked in clinic as an outpatient.   Dispo: Anticipated discharge in approximately tomorrow  Adron Bene, MD 09/24/2021, 8:02 AM Pager: [phone] After 5pm on weekdays and 1pm on weekends: On Call pager [phone]

## 2021-09-25 LAB — GLUCOSE, CAPILLARY: Glucose-Capillary: 126 mg/dL — ABNORMAL HIGH (ref 70–99)

## 2021-09-25 MED ORDER — VITAMIN D (ERGOCALCIFEROL) 1.25 MG (50000 UNIT) PO CAPS: 50000.0000 [IU] | ORAL_CAPSULE | ORAL | 0 refills | Status: AC

## 2021-09-25 MED ORDER — VITAMIN D (ERGOCALCIFEROL) 1.25 MG (50000 UNIT) PO CAPS: 50000.0000 [IU] | ORAL_CAPSULE | ORAL | 0 refills | Status: DC

## 2021-09-25 NOTE — TOC Transition Note (Addendum)
Transition of Care [facility]) - CM/SW Discharge Note   Patient Details  Name: Juan Mahoney MRN: [id number] Date of Birth: 12/23/1990  Transition of Care [facility]) CM/SW Contact:  Bess Kinds, RN Phone Number: [phone] 09/25/2021, 2:55 PM   Clinical Narrative:     Patient to transition home today. F/u information on AVS per MD advising patient to schedule hospital f/u with [facility]. DC medication - vitamin D. Noted PT recommendation for OP rehab - referral sent to neuro rehab for f/u. No further TOC needs identified at this time.   Final next level of care: Home/Self Care Barriers to Discharge: No Barriers Identified   Patient Goals and CMS Choice        Discharge Placement                       Discharge Plan and Services                                     Social Determinants of Health (SDOH) Interventions     Readmission Risk Interventions     No data to display

## 2021-09-26 NOTE — Telephone Encounter (Signed)
I called patient. I spoke with patient's father, Juan Mahoney, per [facility]. He reports that patient was admitted for an MS exacerbation last week. He is doing much better now. However, patient's father is worried about compliance on an oral medication and requested an appointment with Dr. Epimenio Foot for patient to discuss alternatives. An appointment was scheduled for 10/10/21 at 1:30pm. Patient's father verbalized understanding of new appt date and time.

## 2021-10-03 ENCOUNTER — Encounter: Payer: Self-pay | Admitting: Student

## 2021-10-03 ENCOUNTER — Institutional Professional Consult (permissible substitution): Payer: Self-pay | Admitting: Neurology

## 2021-10-06 ENCOUNTER — Ambulatory Visit: Payer: Self-pay | Admitting: Neurology

## 2021-10-07 ENCOUNTER — Ambulatory Visit: Payer: Self-pay | Admitting: Physical Therapy

## 2021-10-10 ENCOUNTER — Encounter: Payer: Self-pay | Admitting: Neurology

## 2021-10-10 ENCOUNTER — Ambulatory Visit (INDEPENDENT_AMBULATORY_CARE_PROVIDER_SITE_OTHER): Payer: Self-pay | Admitting: Neurology

## 2021-10-10 VITALS — BP 121/78 | HR 79 | Ht 69.0 in | Wt 148.0 lb

## 2021-10-10 DIAGNOSIS — R269 Unspecified abnormalities of gait and mobility: Secondary | ICD-10-CM

## 2021-10-10 DIAGNOSIS — G35 Multiple sclerosis: Secondary | ICD-10-CM

## 2021-10-10 DIAGNOSIS — F418 Other specified anxiety disorders: Secondary | ICD-10-CM

## 2021-10-10 DIAGNOSIS — Z79899 Other long term (current) drug therapy: Secondary | ICD-10-CM

## 2021-10-10 MED ORDER — METHYLPHENIDATE HCL ER (OSM) 36 MG PO TBCR: 36.0000 mg | EXTENDED_RELEASE_TABLET | Freq: Every day | ORAL | 0 refills | Status: AC

## 2021-10-10 NOTE — Progress Notes (Signed)
[facility]  PATIENT: Juan Mahoney DOB: May 23, 1990  REFERRING DOCTOR OR PCP:   Meredith Staggers, MD; Dr. Gaynelle Adu, [facility] SOURCE: Patient, notes from Dr. Gaynelle Adu and Dr. Neva Seat, imaging and laboratory reports  _________________________________   HISTORICAL  CHIEF COMPLAINT:  Chief Complaint  Patient presents with   Follow-up    Rm 2, alone. Here to discuss new MS medication. Pt interested in starting Adderall.     HISTORY OF PRESENT ILLNESS:  Juan Mahoney with relapsing remitting multiple sclerosis diagnosed in 2018.     Update 10/10/2021: I saw him a couple weeks ago with a suspected exacerbation while or any DMT (self discontinued) and we started [place]..  A couple days after the visit but before he started [place], he had gone to the ED due to more difficulty walking and MRI showed new MS lesions.  I reviewed the MRI images.  The brain shows additional foci compared to 2020 MRI though none of them enhance.  MRI of the cervical spine show 2-3 enhancing lesions in the upper cervical spine  After he received several days of IV steroid, he had improved gait.    Of note, the new neurologic symptoms with reduced gait and facial numbness started in early June though he felt that they were worse the day after the visit with me prompting the emergency room visit.       He denies much issues but family notes he had other exacerbations and went to ED a few times for steroids.      He is walking better than he did at the visit in June.  He no longer reports numbness.   Bladder function is fine.   Vision is fine.  He has had more fatigue since new numbness started.    He was on  ADD and felt it helps with attention/focus and a little bit for fatigue.  Mood is doing little bit better than 2 to 3 weeks ago.  He lives with his parents.        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       MS history: He was diagnosed with MS after presenmting with right arm numbness in 2018.    He went to the ED and an MRI showed a spinal cord lesion at C1C2.  Follow up brain MRI confirmed multiple lesions in the brain and Thoracic spine.   He was referred to Dr. Gaynelle Adu at [facility] and diagnosed with MS. I saw him for the first time January 2020.  He was started on [place] shortly after that visit and took for only a short time before self d/c.    He was lost to follow-up after 2021 and returned June 2023 after an exacerbation.  I prescribed Zeposia but he did not take it.   IMAGING: MRI of the brain 04/07/2016 showed multiple white matter lesions within the periventricular white matter of the hemispheres and also a lesion in the right internal capsule, right thalamus and left lateral medulla.  There is no definite enhancement noted.   MRI of the cervical spine showed a nonenhancing T2 hyperintense focus at C1-C2.  MRI of the thoracic spine showed a T2 hyperintense lesion on the left adjacent to T4.  It did not enhance.  MRI of the cervical spine 04/27/2018 shows T2 hyperintense focus at the cervicomedullary junction laterally to the right at C6-C7  and posteriorly at C7.  There is a more subtle focus to the right at C4-C5 that is only seen on the axial images.    MRI of the brain 04/27/2018 shows scattered T2/FLAIR hyperintense foci predominantly in the periventricular white matter, radially oriented to the ventricles.  A focus is also noted in the anterior medulla and at the cervicomedullary junction.  Is  MRI of the brain 09/21/2021 showed multiple T2/FLAIR hyperintense foci in the supratentorial and infratentorial white matter.  None of the foci enhanced though there were additional lesions in the hemispheres and right medulla x 2 compared to the 04/27/2018 MRI.  MRI of the cervical spine 09/21/2021 showed multiple foci within the cervical spine including enhancement at lesions at C1-C2, C2-C3 and  C4-C5.  MRI of the thoracic spine 09/23/2021 showed demyelinating plaques at T9 and possibly additional plaques at C7 and T10-T11.  There was no abnormal enhancement.  He has a great uncle (paternal side) with MS who recently passed away. He had severe impairments but was diagnosed in the 1980's.     REVIEW OF SYSTEMS: Constitutional: No fevers, chills, sweats, or change in appetite Eyes: No visual changes, double vision, eye pain Ear, nose and throat: No hearing loss, ear pain, nasal congestion, sore throat Cardiovascular: No chest pain, palpitations Respiratory:  No shortness of breath at rest or with exertion.   No wheezes GastrointestinaI: No nausea, vomiting, diarrhea, abdominal pain, fecal incontinence Genitourinary:  No dysuria, urinary retention or frequency.  No nocturia. Musculoskeletal:  No neck pain, back pain Integumentary: No rash, pruritus, skin lesions Neurological: as above Psychiatric: No depression at this time.  No anxiety Endocrine: No palpitations, diaphoresis, change in appetite, change in weigh or increased thirst Hematologic/Lymphatic:  No anemia, purpura, petechiae. Allergic/Immunologic: No itchy/runny eyes, nasal congestion, recent allergic reactions, rashes  ALLERGIES: Allergies  Allergen Reactions   Amoxicillin Itching and Rash   Penicillin G Rash    DID THE REACTION INVOLVE: Swelling of the face/tongue/throat, SOB, or low BP? No Sudden or severe rash/hives, skin peeling, or the inside of the mouth or nose? Yes Did it require medical treatment? No When did it last happen?      2015 If all above answers are "NO", may proceed with cephalosporin use.     HOME MEDICATIONS:  Current Outpatient Medications:    ibuprofen (ADVIL) 800 MG tablet, Take 1 tablet (800 mg total) by mouth every 8 (eight) hours as needed., Disp: 21 tablet, Rfl: 0   methylphenidate (CONCERTA) 36 MG PO CR tablet, Take 1 tablet (36 mg total) by mouth daily., Disp: 30 tablet, Rfl: 0    Ozanimod HCl 0.92 MG CAPS, One po qd, Disp: 30 capsule, Rfl: 11   sertraline (ZOLOFT) 50 MG tablet, Take 1 tablet (50 mg total) by mouth daily., Disp: 30 tablet, Rfl: 11   Vitamin D, Ergocalciferol, (DRISDOL) 1.25 MG (50000 UNIT) CAPS capsule, Take 1 capsule (50,000 Units total) by mouth every 7 (seven) days for 8 doses., Disp: 8 capsule, Rfl: 0  PAST MEDICAL HISTORY: Past Medical History:  Diagnosis Date   Multiple sclerosis (HCC)    Dx 2018    PAST SURGICAL HISTORY: Past Surgical History:  Procedure Laterality Date   ARTHROSCOPIC REPAIR ACL Right     FAMILY HISTORY: Family History  Problem Relation Age of Onset   Hypertension Mother    Cancer Maternal Grandmother     SOCIAL HISTORY:  Social History   Socioeconomic History   Marital status: Single  Spouse name: Not on file   Number of children: Not on file   Years of education: Not on file   Highest education level: Not on file  Occupational History   Not on file  Tobacco Use   Smoking status: Never   Smokeless tobacco: Never  Vaping Use   Vaping Use: Never used  Substance and Sexual Activity   Alcohol use: No   Drug use: Not Currently    Types: Marijuana   Sexual activity: Not Currently  Other Topics Concern   Not on file  Social History Narrative   Lives   Caffeine use: Soda sometimes    Right handed    Social Determinants of Health   Financial Resource Strain: Not on file  Food Insecurity: Not on file  Transportation Needs: Not on file  Physical Activity: Not on file  Stress: Not on file  Social Connections: Not on file  Intimate Partner Violence: Not on file     PHYSICAL EXAM  Vitals:   10/10/21 1330  BP: 121/78  Pulse: 79  Weight: 148 lb (67.1 kg)  Height: 5\' 9"  (1.753 m)     Body mass index is 21.86 kg/m.   General: The patient is well-developed and well-nourished and in no acute distress   Skin: Extremities are without rash or edema.   Neurologic Exam  Mental status: The  patient is alert and oriented x 3 at the time of the examination. The patient has apparent normal recent and remote memory, with an apparently normal attention span and concentration ability.   Speech is normal.  Cranial nerves: Extraocular movements are full. Pupils are equal, round, and reactive to light and accomodation.  Visual fields are full.  Facial symmetry is present. There is good facial sensation to soft touch bilaterally.Facial strength is normal.  Trapezius and sternocleidomastoid strength is normal. No dysarthria is noted.    No obvious hearing deficits are noted.  Motor:  Muscle bulk is normal.   Tone is normal. Strength is  5 / 5 in all 4 extremities.   Sensory: Sensory testing is intact to pinprick, soft touch and vibration sensation in all 4 extremities.  Coordination: Cerebellar testing reveals good finger-nose-finger and heel-to-shin bilaterally.  Gait and station: Station is normal.   Gait is normal now.  Tandem gait is mildly wide.. Romberg is negative.   Reflexes: Deep tendon reflexes are symmetric but increased with crossed adductor responses at the knees.   No ankle clonus.   Plantar responses are flexor.    DIAGNOSTIC DATA (LABS, IMAGING, TESTING) - I reviewed patient records, labs, notes, testing and imaging myself where available.  Lab Results  Component Value Date   WBC 13.4 (H) 09/22/2021   HGB 16.3 09/22/2021   HCT 47.7 09/22/2021   MCV 83.4 09/22/2021   PLT 220 09/22/2021      Component Value Date/Time   NA 134 (L) 09/22/2021 0353   K 3.8 09/22/2021 0353   CL 101 09/22/2021 0353   CO2 25 09/22/2021 0353   GLUCOSE 129 (H) 09/22/2021 0353   BUN 9 09/22/2021 0353   CREATININE 1.08 09/22/2021 0353   CALCIUM 9.2 09/22/2021 0353   PROT 7.6 09/20/2021 1909   PROT 6.3 06/16/2019 1454   ALBUMIN 4.8 09/20/2021 1909   ALBUMIN 4.3 06/16/2019 1454   AST 16 09/20/2021 1909   ALT 9 09/20/2021 1909   ALKPHOS 64 09/20/2021 1909   BILITOT 1.1 09/20/2021 1909    BILITOT 0.4 06/16/2019 1454  GFRNONAA >60 09/22/2021 0353   GFRAA >60 03/31/2018 0802       ASSESSMENT AND PLAN  Multiple sclerosis (HCC)  Gait disturbance  Depression with anxiety  High risk medication use  1.   He will continue Zeposia.  We discussed the importance of staying on therapy.  If he ever runs out of medication and is unable to get a refill quickly he should let us know.  If for any reason he does not like the Zeposia and would like to consider a different medication we can initiate 1.   2.   Continue sertraline.   3.   Stay active and exercise as tolerated. 4.   Return in 6 months or sooner for new or worsening neurologic symptoms.  Shizue Kaseman A. Epimenio Foot, MD, [facility] 10/10/2021, 5:00 PM Certified in Neurology, Clinical Neurophysiology, Sleep Medicine, Pain Medicine and Neuroimaging  [facility] [address], [address] [place], [place] [address] [phone]

## 2021-11-02 ENCOUNTER — Telehealth: Payer: Self-pay | Admitting: Neurology

## 2021-11-02 NOTE — Telephone Encounter (Signed)
Karina/ [employer] said they have been trying to reach Pt but has been unsuccessful. Donata Clay said she was trying to get some help from provider.

## 2021-11-02 NOTE — Telephone Encounter (Signed)
Called [phone]. Father picked up and asked we call son at his# [phone]. I called, pt picked up but then ended call. I called back and call ended again. I called a third time. I was able to provide him [employer] phone # to call back. He states he will call back.

## 2022-01-17 ENCOUNTER — Encounter: Payer: Self-pay | Admitting: Neurology

## 2022-01-17 ENCOUNTER — Ambulatory Visit: Payer: Self-pay | Admitting: Neurology

## 2023-01-16 ENCOUNTER — Ambulatory Visit: Payer: Self-pay | Admitting: Family Medicine

## 2023-03-22 ENCOUNTER — Telehealth: Payer: Self-pay | Admitting: *Deleted

## 2023-03-22 NOTE — Telephone Encounter (Signed)
Left message for pt to return phone call

## 2023-05-02 ENCOUNTER — Encounter (HOSPITAL_COMMUNITY): Payer: Self-pay

## 2023-05-02 ENCOUNTER — Ambulatory Visit (HOSPITAL_COMMUNITY)
Admission: RE | Admit: 2023-05-02 | Discharge: 2023-05-02 | Disposition: A | Discharge status: Home / Self Care | Payer: Self-pay | Source: Ambulatory Visit | Attending: Sports Medicine | Admitting: Sports Medicine

## 2023-05-02 ENCOUNTER — Ambulatory Visit (HOSPITAL_COMMUNITY): Payer: Self-pay

## 2023-05-02 VITALS — BP 122/72 | HR 69 | Temp 98.5°F | Ht 69.0 in | Wt 155.0 lb

## 2023-05-02 DIAGNOSIS — S0502XA Injury of conjunctiva and corneal abrasion without foreign body, left eye, initial encounter: Secondary | ICD-10-CM

## 2023-05-02 MED ORDER — FLUORESCEIN SODIUM 1 MG OP STRP
ORAL_STRIP | OPHTHALMIC | Status: AC
  Filled 2023-05-02: qty 1

## 2023-05-02 MED ORDER — TETRACAINE HCL 0.5 % OP SOLN
OPHTHALMIC | Status: AC
  Filled 2023-05-02: qty 4

## 2023-05-02 MED ORDER — GENTAMICIN SULFATE 0.3 % OP SOLN: 1.0000 [drp] | OPHTHALMIC | 0 refills | Status: AC

## 2023-05-02 NOTE — ED Triage Notes (Signed)
Patient states that he was playing basketball and was hit in the left eye w/an elbow.  The left eyelid does have some swelling and sensitive to light.  They eye is watery.  Patient does wear reading glasses.

## 2023-05-02 NOTE — Discharge Instructions (Addendum)
You have an small abrasion of the cornea. I have sent you home with tetracycline (numbing) drops to use one drop in you eye up to once per hour over the next 24 hours. Discard this after using over the next 24 hours - prolonged use can worsen the abrasion and delay healing. Wearing sunglasses can be helpful as well.  To prevent an infection on top of the abrasion I have sent you topical antibiotics to use (Gentamicin). Apply 1-2 drops in the eye every 4 hours over the next week.  I have referred you to an eye doctor and recommend you see them in the next 3-5 days.

## 2023-05-02 NOTE — ED Provider Notes (Signed)
[facility]    CSN: [id number] Arrival date & time: 05/02/23  1043      History   Chief Complaint Chief Complaint  Patient presents with   Eye Pain    HPI Juan Mahoney is a 33 y.o. male.   Juan Mahoney is a 33yo male here with complaint of left eye pain.  He reports that he was playing basketball 2 nights ago and got elbowed in the left eye.  He had pain afterwards and had some swelling of his eyelids.  His pain has persisted and feels as though it is more severe today.  He denies blurring of his vision.  He does wear contacts at baseline though has not been wearing these over the past few days and was not wearing them during the time of the injury.  He feels as though there is a foreign body sensation in the eye.  He has not been doing anything for the pain.  He tried to wear an eye patch at work yesterday though his work sent him home and said that he needed to be evaluated.  He has no other complaints.  The history is provided by the patient.  Eye Pain    Past Medical History:  Diagnosis Date   Multiple sclerosis (HCC)    Dx 2018    Patient Active Problem List   Diagnosis Date Noted   Gait disturbance 10/10/2021   Depression with anxiety 10/10/2021   Current episode of major depressive disorder without prior episode    Transverse myelitis (HCC) 04/16/2018   ADD (attention deficit disorder) 04/16/2018   Multiple sclerosis (HCC) 09/18/2016    Past Surgical History:  Procedure Laterality Date   ARTHROSCOPIC REPAIR ACL Right        Home Medications    Prior to Admission medications   Medication Sig Start Date End Date Taking? Authorizing Provider  gentamicin (GARAMYCIN) 0.3 % ophthalmic solution Place 1 drop into the left eye every 4 (four) hours for 7 days. 05/02/23 05/09/23 Yes Marisa Cyphers, MD  methylphenidate (CONCERTA) 36 MG PO CR tablet Take 1 tablet (36 mg total) by mouth daily. 10/10/21  Yes Sater, Pearletha Furl, MD  Ozanimod HCl 0.92 MG  CAPS One po qd 09/19/21  Yes Sater, Pearletha Furl, MD  ibuprofen (ADVIL) 800 MG tablet Take 1 tablet (800 mg total) by mouth every 8 (eight) hours as needed. 12/02/20   Long, Arlyss Repress, MD  sertraline (ZOLOFT) 50 MG tablet Take 1 tablet (50 mg total) by mouth daily. 09/19/21   Sater, Pearletha Furl, MD    Family History Family History  Problem Relation Age of Onset   Hypertension Mother    Cancer Maternal Grandmother     Social History Social History   Tobacco Use   Smoking status: Never   Smokeless tobacco: Never  Vaping Use   Vaping status: Never Used  Substance Use Topics   Alcohol use: No   Drug use: Not Currently    Types: Marijuana     Allergies   Amoxicillin and Penicillin g   Review of Systems Review of Systems  Eyes:  Positive for pain.     Physical Exam Triage Vital Signs ED Triage Vitals  Encounter Vitals Group     BP 05/02/23 1121 122/72     Systolic BP Percentile --      Diastolic BP Percentile --      Pulse Rate 05/02/23 1121 69     Resp --  Temp 05/02/23 1121 98.5 F (36.9 C)     Temp Source 05/02/23 1121 Oral     SpO2 05/02/23 1121 97 %     Weight 05/02/23 1123 155 lb (70.3 kg)     Height 05/02/23 1123 5\' 9"  (1.753 m)     Head Circumference --      Peak Flow --      Pain Score 05/02/23 1123 8     Pain Loc --      Pain Education --      Exclude from Growth Chart --    No data found.  Updated Vital Signs BP 122/72 (BP Location: Right Arm)   Pulse 69   Temp 98.5 F (36.9 C) (Oral)   Ht 5\' 9"  (1.753 m)   Wt 70.3 kg   SpO2 97%   BMI 22.89 kg/m   Visual Acuity Right Eye Distance: 20 50 Left Eye Distance: 20 50 Bilateral Distance: 20 40  Right Eye Near:   Left Eye Near:    Bilateral Near:     Physical Exam Constitutional:      General: He is in acute distress.     Appearance: Normal appearance. He is not ill-appearing or toxic-appearing.  HENT:     Head: Normocephalic and atraumatic.     Nose: Nose normal.     Mouth/Throat:      Mouth: Mucous membranes are moist.     Pharynx: Oropharynx is clear.  Eyes:     General: Lids are everted, no foreign bodies appreciated. Vision grossly intact. Gaze aligned appropriately. No visual field deficit.    Extraocular Movements: Extraocular movements intact.     Right eye: Normal extraocular motion and no nystagmus.     Left eye: Normal extraocular motion and no nystagmus.     Conjunctiva/sclera:     Right eye: Right conjunctiva is not injected. No exudate or hemorrhage.    Left eye: Left conjunctiva is injected. No exudate or hemorrhage.     Comments: Mild swelling of left upper lid. No TTP along superior or inferior orbit. Pupils equal and reactive to light. Fluorescein light exam of left eye does show pooling of contrast in the cornea measuring ~27mm just superolateral to left pupil. No water falling of contrast through cornea. No other corneal lesions noted.  Neurological:     Mental Status: He is alert.      UC Treatments / Results  Labs (all labs ordered are listed, but only abnormal results are displayed) Labs Reviewed - No data to display  EKG   Radiology No results found.  Procedures Procedures (including critical care time)  Medications Ordered in UC Medications - No data to display  Initial Impression / Assessment and Plan / UC Course  I have reviewed the triage vital signs and the nursing notes.  Pertinent labs & imaging results that were available during my care of the patient were reviewed by me and considered in my medical decision making (see chart for details).    Vitals and triage reviewed, patient is hemodynamically stable.  Corneal abrasion noted to the left cornea superior pupil-iris border.  No complicating features and vision grossly intact.  I did provide him tetracaine 0.5% eyedrops to be used as needed every 30 minutes to 1 hour over the next 24 hours.  Strictly counseled him to avoid use longer than 24 hours.  Advised him he can  continue to use sunglasses as he does have some photophobia.  Prophylactic gentamicin eyedrops provided to  the patient to use every 4 hours over the next 7 days.  Advised follow-up with ophthalmology in the next 3 to 5 days, referral placed.  All of patient's questions were answered and he is in agreement this plan.  Strict return precautions were reviewed with the patient.  Final Clinical Impressions(s) / UC Diagnoses   Final diagnoses:  Abrasion of left cornea, initial encounter     Discharge Instructions      You have an small abrasion of the cornea. I have sent you home with tetracycline (numbing) drops to use one drop in you eye up to once per hour over the next 24 hours. Discard this after using over the next 24 hours - prolonged use can worsen the abrasion and delay healing. Wearing sunglasses can be helpful as well.  To prevent an infection on top of the abrasion I have sent you topical antibiotics to use (Gentamicin). Apply 1-2 drops in the eye every 4 hours over the next week.  I have referred you to an eye doctor and recommend you see them in the next 3-5 days.     ED Prescriptions     Medication Sig Dispense Auth. Provider   gentamicin (GARAMYCIN) 0.3 % ophthalmic solution Place 1 drop into the left eye every 4 (four) hours for 7 days. 5 mL Marisa Cyphers, MD      PDMP not reviewed this encounter.   Marisa Cyphers, MD 05/02/23 [phone]

## 2023-05-05 ENCOUNTER — Ambulatory Visit (HOSPITAL_COMMUNITY)
Admission: EM | Admit: 2023-05-05 | Discharge: 2023-05-05 | Disposition: A | Discharge status: Home / Self Care | Payer: Self-pay | Attending: Emergency Medicine | Admitting: Emergency Medicine

## 2023-05-05 ENCOUNTER — Encounter (HOSPITAL_COMMUNITY): Payer: Self-pay

## 2023-05-05 DIAGNOSIS — M62830 Muscle spasm of back: Secondary | ICD-10-CM

## 2023-05-05 MED ORDER — METHOCARBAMOL 500 MG PO TABS: 500.0000 mg | ORAL_TABLET | Freq: Two times a day (BID) | ORAL | 0 refills | Status: AC

## 2023-05-05 MED ORDER — DEXAMETHASONE SODIUM PHOSPHATE 10 MG/ML IJ SOLN
10.0000 mg | Freq: Once | INTRAMUSCULAR | Status: AC
Start: 2023-05-05 — End: 2023-05-05
  Administered 2023-05-05: 10 mg via INTRAMUSCULAR

## 2023-05-05 MED ORDER — DEXAMETHASONE SODIUM PHOSPHATE 10 MG/ML IJ SOLN
INTRAMUSCULAR | Status: AC
  Filled 2023-05-05: qty 1

## 2023-05-05 NOTE — ED Provider Notes (Signed)
[facility]    CSN: [id number] Arrival date & time: 05/05/23  1005      History   Chief Complaint Chief Complaint  Patient presents with   Back Pain    HPI Juan Mahoney is a 33 y.o. male.   Patient presents with right upper back pain that began this morning.  Denies any known injury.  Patient denies taking anything for the pain.  Patient has history of multiple sclerosis and thinks the pain could be related to lesions on his spine.  Denies numbness, tingling, and weakness.    Back Pain Associated symptoms: no numbness and no weakness     Past Medical History:  Diagnosis Date   Multiple sclerosis (HCC)    Dx 2018    Patient Active Problem List   Diagnosis Date Noted   Gait disturbance 10/10/2021   Depression with anxiety 10/10/2021   Current episode of major depressive disorder without prior episode    Transverse myelitis (HCC) 04/16/2018   ADD (attention deficit disorder) 04/16/2018   Multiple sclerosis (HCC) 09/18/2016    Past Surgical History:  Procedure Laterality Date   ARTHROSCOPIC REPAIR ACL Right        Home Medications    Prior to Admission medications   Medication Sig Start Date End Date Taking? Authorizing Provider  gentamicin (GARAMYCIN) 0.3 % ophthalmic solution Place 1 drop into the left eye every 4 (four) hours for 7 days. 05/02/23 05/09/23 Yes Marisa Cyphers, MD  methocarbamol (ROBAXIN) 500 MG tablet Take 1 tablet (500 mg total) by mouth 2 (two) times daily. 05/05/23  Yes Wynonia Lawman A, NP  methylphenidate (CONCERTA) 36 MG PO CR tablet Take 1 tablet (36 mg total) by mouth daily. 10/10/21  Yes Sater, Pearletha Furl, MD  Ozanimod HCl 0.92 MG CAPS One po qd 09/19/21  Yes Sater, Pearletha Furl, MD  sertraline (ZOLOFT) 50 MG tablet Take 1 tablet (50 mg total) by mouth daily. 09/19/21  Yes Sater, Pearletha Furl, MD    Family History Family History  Problem Relation Age of Onset   Hypertension Mother    Cancer Maternal Grandmother      Social History Social History   Tobacco Use   Smoking status: Never   Smokeless tobacco: Never  Vaping Use   Vaping status: Never Used  Substance Use Topics   Alcohol use: No   Drug use: Not Currently    Types: Marijuana     Allergies   Amoxicillin and Penicillin g   Review of Systems Review of Systems  Musculoskeletal:  Positive for back pain and myalgias.  Neurological:  Negative for weakness and numbness.     Physical Exam Triage Vital Signs ED Triage Vitals  Encounter Vitals Group     BP 05/05/23 1033 (!) 115/54     Systolic BP Percentile --      Diastolic BP Percentile --      Pulse Rate 05/05/23 1033 68     Resp 05/05/23 1033 16     Temp 05/05/23 1033 98 F (36.7 C)     Temp Source 05/05/23 1033 Oral     SpO2 05/05/23 1033 97 %     Weight 05/05/23 1033 155 lb (70.3 kg)     Height 05/05/23 1033 5\' 9"  (1.753 m)     Head Circumference --      Peak Flow --      Pain Score 05/05/23 1032 8     Pain Loc --  Pain Education --      Exclude from Growth Chart --    No data found.  Updated Vital Signs BP (!) 115/54 (BP Location: Left Arm)   Pulse 68   Temp 98 F (36.7 C) (Oral)   Resp 16   Ht 5\' 9"  (1.753 m)   Wt 155 lb (70.3 kg)   SpO2 97%   BMI 22.89 kg/m   Visual Acuity Right Eye Distance:   Left Eye Distance:   Bilateral Distance:    Right Eye Near:   Left Eye Near:    Bilateral Near:     Physical Exam Vitals and nursing note reviewed.  Constitutional:      General: He is awake. He is not in acute distress.    Appearance: Normal appearance. He is well-developed and well-groomed. He is not ill-appearing.  Musculoskeletal:        General: Normal range of motion.     Cervical back: Normal.     Thoracic back: Spasms and tenderness present. No swelling or edema. Normal range of motion.     Lumbar back: Normal.     Comments: Moderately tender upon palpation to right rhomboid.  Skin:    General: Skin is warm and dry.  Neurological:      Mental Status: He is alert.  Psychiatric:        Behavior: Behavior is cooperative.      UC Treatments / Results  Labs (all labs ordered are listed, but only abnormal results are displayed) Labs Reviewed - No data to display  EKG   Radiology No results found.  Procedures Procedures (including critical care time)  Medications Ordered in UC Medications  dexamethasone (DECADRON) injection 10 mg (has no administration in time range)    Initial Impression / Assessment and Plan / UC Course  I have reviewed the triage vital signs and the nursing notes.  Pertinent labs & imaging results that were available during my care of the patient were reviewed by me and considered in my medical decision making (see chart for details).     Patient presented with right upper back pain that began this morning.  Denies any known injury.  Reports history of multiple sclerosis and thinks the pain could be related to lesions on his spine.  Denies numbness, tingling, and weakness.  Upon assessment patient is moderately tender upon palpation to right rhomboid muscle.  No other significant findings upon exam.  Given Decadron injection in clinic for inflammation.  Prescribed Robaxin as needed for muscle pain and spasms.  Discussed follow-up and return precautions. Final Clinical Impressions(s) / UC Diagnoses   Final diagnoses:  Spasm of thoracic back muscle     Discharge Instructions      I do not believe your pain is from an MS flare.  I believe it is a muscle strain with spasms.  We have given you a steroid shot here today to help with inflammation causing your pain.  I have prescribed Robaxin which is a muscle relaxer that you can take twice daily as needed for muscle pain and spasms.  This can make you drowsy so do not take while working or driving.  Otherwise alternate between Tylenol and ibuprofen as needed for pain.  You can also alternate between ice and heat.  I given you an  orthopedic doctor you can follow-up with if pain persist.  Return here as needed.    ED Prescriptions     Medication Sig Dispense Auth. Provider  methocarbamol (ROBAXIN) 500 MG tablet Take 1 tablet (500 mg total) by mouth 2 (two) times daily. 20 tablet Wynonia Lawman A, NP      PDMP not reviewed this encounter.   Wynonia Lawman A, NP 05/05/23 1053

## 2023-05-05 NOTE — ED Triage Notes (Signed)
Patient having back pain in the upper back. Patient had lesions on the spine fro MS. Onset of the pain this morning.   Patient has not taken anything for the pain.

## 2023-05-05 NOTE — Discharge Instructions (Signed)
I do not believe your pain is from an MS flare.  I believe it is a muscle strain with spasms.  We have given you a steroid shot here today to help with inflammation causing your pain.  I have prescribed Robaxin which is a muscle relaxer that you can take twice daily as needed for muscle pain and spasms.  This can make you drowsy so do not take while working or driving.  Otherwise alternate between Tylenol and ibuprofen as needed for pain.  You can also alternate between ice and heat.  I given you an orthopedic doctor you can follow-up with if pain persist.  Return here as needed.
